# Patient Record
Sex: Male | Born: 1960
Health system: Southern US, Community
[De-identification: ages and names within clinical notes are randomized; demographics above are authoritative.]

## PROBLEM LIST (undated history)

## (undated) DIAGNOSIS — J40 Bronchitis, not specified as acute or chronic: Secondary | ICD-10-CM

---

## 2018-02-27 ENCOUNTER — Encounter (HOSPITAL_COMMUNITY): Payer: Self-pay

## 2018-02-27 ENCOUNTER — Emergency Department (HOSPITAL_COMMUNITY): Payer: Medicaid Other

## 2018-02-27 ENCOUNTER — Emergency Department (HOSPITAL_COMMUNITY)
Admission: EM | Admit: 2018-02-27 | Discharge: 2018-02-27 | Disposition: A | Discharge status: Home / Self Care | Payer: Medicaid Other | Attending: Emergency Medicine | Admitting: Emergency Medicine

## 2018-02-27 DIAGNOSIS — R05 Cough: Secondary | ICD-10-CM | POA: Diagnosis present

## 2018-02-27 DIAGNOSIS — F17210 Nicotine dependence, cigarettes, uncomplicated: Secondary | ICD-10-CM | POA: Insufficient documentation

## 2018-02-27 DIAGNOSIS — J209 Acute bronchitis, unspecified: Secondary | ICD-10-CM | POA: Diagnosis not present

## 2018-02-27 DIAGNOSIS — R062 Wheezing: Secondary | ICD-10-CM

## 2018-02-27 DIAGNOSIS — Z720 Tobacco use: Secondary | ICD-10-CM

## 2018-02-27 MED ORDER — IPRATROPIUM-ALBUTEROL 0.5-2.5 (3) MG/3ML IN SOLN
3.0000 mL | Freq: Once | RESPIRATORY_TRACT | Status: AC
  Administered 2018-02-27: 3 mL via RESPIRATORY_TRACT
  Filled 2018-02-27: qty 3

## 2018-02-27 MED ORDER — HYDROCODONE-HOMATROPINE 5-1.5 MG/5ML PO SYRP: 5.0000 mL | ORAL_SOLUTION | Freq: Four times a day (QID) | ORAL | 0 refills | Status: DC | PRN

## 2018-02-27 MED ORDER — PREDNISONE 20 MG PO TABS: ORAL_TABLET | ORAL | 0 refills | Status: DC

## 2018-02-27 MED ORDER — ALBUTEROL SULFATE HFA 108 (90 BASE) MCG/ACT IN AERS
2.0000 | INHALATION_SPRAY | RESPIRATORY_TRACT | Status: DC
  Administered 2018-02-27: 2 via RESPIRATORY_TRACT
  Filled 2018-02-27: qty 6.7

## 2018-02-27 MED ORDER — ALBUTEROL SULFATE (2.5 MG/3ML) 0.083% IN NEBU
5.0000 mg | INHALATION_SOLUTION | Freq: Once | RESPIRATORY_TRACT | Status: AC
  Administered 2018-02-27: 5 mg via RESPIRATORY_TRACT
  Filled 2018-02-27: qty 6

## 2018-02-27 MED ORDER — PREDNISONE 20 MG PO TABS
60.0000 mg | ORAL_TABLET | Freq: Once | ORAL | Status: AC
  Administered 2018-02-27: 60 mg via ORAL
  Filled 2018-02-27: qty 3

## 2018-02-27 NOTE — ED Provider Notes (Signed)
[facility] EMERGENCY DEPARTMENT Provider Note   CSN: [id number] Arrival date & time: 02/27/18  1338     History   Chief Complaint Chief Complaint  Patient presents with  . Wheezing    HPI Dwayne Craig is a 57 y.o. male.  HPI Patient reports he is coughing for about a week.  Cough is fairly dry.  He reports he had some fever the beginning of the week but that is resolved.  Cough is worse at night.  No chest pain.  Slight shortness of breath with coughing episodes.  No nausea no vomiting.  No lower extremity swelling.  Patient reports he tries to keep himself in good health.  He denies other medical problems. History reviewed. No pertinent past medical history.  There are no active problems to display for this patient.   History reviewed. No pertinent surgical history.      Home Medications    Prior to Admission medications   Medication Sig Start Date End Date Taking? Authorizing Provider  HYDROcodone-homatropine (HYCODAN) 5-1.5 MG/5ML syrup Take 5 mLs by mouth every 6 (six) hours as needed for cough. 5 to 10 mL's for severe nigthtime cough 02/27/18   Arby Barrette, MD  predniSONE (DELTASONE) 20 MG tablet 2 tabs po daily x 3 days 02/27/18   Arby Barrette, MD    Family History No family history on file.  Social History Social History   Tobacco Use  . Smoking status: Current Every Day Smoker    Packs/day: 0.50    Types: Cigarettes  . Smokeless tobacco: Never Used  Substance Use Topics  . Alcohol use: Never    Frequency: Never  . Drug use: Not Currently     Allergies   Patient has no known allergies.   Review of Systems Review of Systems 10 Systems reviewed and are negative for acute change except as noted in the HPI.   Physical Exam Updated Vital Signs BP 124/71   Pulse 69   Temp 98 F (36.7 C) (Oral)   Resp 18   SpO2 100%   Physical Exam  Constitutional: He is oriented to person, place, and time.  She is alert and  nontoxic.  No respiratory distress.  Thin but well-nourished well-developed.  HENT:  Head: Normocephalic and atraumatic.  Mouth/Throat: Oropharynx is clear and moist.  Eyes: EOM are normal.  Cardiovascular: Normal rate, regular rhythm, normal heart sounds and intact distal pulses.  Pulmonary/Chest:  Cough paroxysmal with deep inspiration.  Diffuse wheeze throughout all lung fields.  Adequate airflow to the bases.  Abdominal: Soft. He exhibits no distension. There is no tenderness. There is no guarding.  Musculoskeletal: Normal range of motion. He exhibits no edema or tenderness.  No peripheral edema.  Calves are soft and nontender.  Neurological: He is alert and oriented to person, place, and time. He exhibits normal muscle tone. Coordination normal.  Skin: Skin is warm and dry.  Psychiatric: He has a normal mood and affect.     ED Treatments / Results  Labs (all labs ordered are listed, but only abnormal results are displayed) Labs Reviewed - No data to display  EKG None  Radiology Dg Chest 2 View  Result Date: 02/27/2018 CLINICAL DATA:  Wheezing and productive cough 1 week with fever. EXAM: CHEST - 2 VIEW COMPARISON:  None. FINDINGS: Lungs are somewhat hyperexpanded without focal airspace consolidation or effusion. Minimal prominence of the hilar regions. Cardiomediastinal silhouette is within normal. Bones and soft tissues are normal. IMPRESSION: No  acute cardiopulmonary disease. Minimal prominence of the hilar regions. Recommend follow-up chest radiograph 3 months. Electronically Signed   By: Elberta Fortisaniel  Boyle M.D.   On: 02/27/2018 14:56    Procedures Procedures (including critical care time)  Medications Ordered in ED Medications  albuterol (PROVENTIL HFA;VENTOLIN HFA) 108 (90 Base) MCG/ACT inhaler 2 puff (2 puffs Inhalation Given 02/27/18 1557)  albuterol (PROVENTIL) (2.5 MG/3ML) 0.083% nebulizer solution 5 mg (5 mg Nebulization Given 02/27/18 1349)  ipratropium-albuterol  (DUONEB) 0.5-2.5 (3) MG/3ML nebulizer solution 3 mL (3 mLs Nebulization Given 02/27/18 1557)  predniSONE (DELTASONE) tablet 60 mg (60 mg Oral Given 02/27/18 1556)     Initial Impression / Assessment and Plan / ED Course  I have reviewed the triage vital signs and the nursing notes.  Pertinent labs & imaging results that were available during my care of the patient were reviewed by me and considered in my medical decision making (see chart for details).     Final Clinical Impressions(s) / ED Diagnoses   Final diagnoses:  Acute bronchitis, unspecified organism  Wheezing  Tobacco abuse   Has had cough with wheezing for about a week.  Chest x-ray does not show focal pneumonia.  Patient does not have pleuritic chest pain or shortness of breath.  He does have smoking history.  At this time will treat for bronchitis.  Return precautions reviewed.  Patient reports he will get established with a primary care provider. ED Discharge Orders        Ordered    predniSONE (DELTASONE) 20 MG tablet     02/27/18 1652    HYDROcodone-homatropine (HYCODAN) 5-1.5 MG/5ML syrup  Every 6 hours PRN     02/27/18 1652       Arby BarrettePfeiffer, Dell Briner, MD 02/27/18 1657

## 2018-02-27 NOTE — ED Notes (Signed)
Patient able to ambulate independently  

## 2018-02-27 NOTE — ED Notes (Signed)
Patient already dressed when this RN came in to discharge, refused vitals recheck before d/c.

## 2018-02-27 NOTE — Discharge Instructions (Addendum)
1.  Prednisone as prescribed for the next 3 days.  Use the inhaler you are given in the emergency department every 4 hours for the next 2 to 3 days then as needed.  You must quit smoking. 2.  Schedule an appointment with a family doctor.  Use the referral number in your discharge instructions to find one. 3.  Return to the emergency department if you have worsening cough, wheeze or shortness of breath.

## 2018-02-27 NOTE — ED Triage Notes (Signed)
Pt presents for evaluation of wheezing and coughing x 1 week. Pt reports worse at night. No dx of asthma, copd, chf. Pt reports some shortness of breath.

## 2018-02-27 NOTE — ED Notes (Signed)
Pt completed breathing treatment, will notify PA

## 2018-02-27 NOTE — ED Notes (Signed)
Pt transported to X-Ray at 14:32

## 2018-06-17 ENCOUNTER — Other Ambulatory Visit: Payer: Self-pay

## 2018-06-17 ENCOUNTER — Emergency Department (HOSPITAL_COMMUNITY)
Admission: EM | Admit: 2018-06-17 | Discharge: 2018-06-17 | Disposition: A | Discharge status: Home / Self Care | Payer: Medicaid Other | Attending: Emergency Medicine | Admitting: Emergency Medicine

## 2018-06-17 ENCOUNTER — Encounter (HOSPITAL_COMMUNITY): Payer: Self-pay | Admitting: *Deleted

## 2018-06-17 DIAGNOSIS — F17210 Nicotine dependence, cigarettes, uncomplicated: Secondary | ICD-10-CM | POA: Insufficient documentation

## 2018-06-17 DIAGNOSIS — M542 Cervicalgia: Secondary | ICD-10-CM | POA: Diagnosis present

## 2018-06-17 MED ORDER — METHOCARBAMOL 500 MG PO TABS: 500.0000 mg | ORAL_TABLET | Freq: Three times a day (TID) | ORAL | 0 refills | Status: DC | PRN

## 2018-06-17 MED ORDER — NAPROXEN 500 MG PO TABS: 500.0000 mg | ORAL_TABLET | Freq: Two times a day (BID) | ORAL | 0 refills | Status: DC

## 2018-06-17 NOTE — ED Triage Notes (Signed)
Pt in c/o L shoulder pain and L neck pain worse with movement at work and turning head onset x 1 wk, denies n.v.d, denies SOB

## 2018-06-17 NOTE — ED Provider Notes (Signed)
[facility] EMERGENCY DEPARTMENT Provider Note   CSN: [id number] Arrival date & time: 06/17/18  1017     History   Chief Complaint Chief Complaint  Patient presents with  . Shoulder Pain    HPI Dwayne Craig is a 57 y.o. male with a hx of tobacco abuse who presents to the ED with complaints of neck pain for the past 1 week.  Patient describes the pain as being to the left side of his neck, fairly constant, worse with certain movements and positions.  No alleviating factors.  He has not tried intervention prior to arrival.  States pain is achy. He states he does a lot of heavy lifting and movements at work and thinks this may have caused the problem. Denies numbness, tingling, weakness, incontinence to bowel/bladder, fever, chills, IV drug use, or hx of cancer. Denies traumatic injury. Denies dyspnea/chest pain.   HPI  History reviewed. No pertinent past medical history.  There are no active problems to display for this patient.   History reviewed. No pertinent surgical history.      Home Medications    Prior to Admission medications   Medication Sig Start Date End Date Taking? Authorizing Provider  HYDROcodone-homatropine (HYCODAN) 5-1.5 MG/5ML syrup Take 5 mLs by mouth every 6 (six) hours as needed for cough. 5 to 10 mL's for severe nigthtime cough 02/27/18   Arby Barrette, MD  predniSONE (DELTASONE) 20 MG tablet 2 tabs po daily x 3 days 02/27/18   Arby Barrette, MD    Family History No family history on file.  Social History Social History   Tobacco Use  . Smoking status: Current Every Day Smoker    Packs/day: 0.50    Types: Cigarettes  . Smokeless tobacco: Never Used  Substance Use Topics  . Alcohol use: Never    Frequency: Never  . Drug use: Not Currently     Allergies   Patient has no known allergies.   Review of Systems Review of Systems  Constitutional: Negative for chills and fever.  Respiratory: Negative for shortness of  breath.   Cardiovascular: Negative for chest pain.  Musculoskeletal: Positive for neck pain. Negative for neck stiffness.  Skin: Negative for color change, pallor and wound.  Neurological: Negative for weakness and numbness.       Negative for incontinence or saddle anesthesia.      Physical Exam Updated Vital Signs BP 106/74 (BP Location: Right Arm)   Pulse 77   Temp (!) 97.2 F (36.2 C) (Oral)   Resp 16   Ht 5\' 11"  (1.803 m)   Wt 77.1 kg   SpO2 97%   BMI 23.71 kg/m   Physical Exam  Constitutional: He appears well-developed and well-nourished. No distress.  HENT:  Head: Normocephalic and atraumatic.  Eyes: Conjunctivae are normal. Right eye exhibits no discharge. Left eye exhibits no discharge.  Neck: Muscular tenderness (left sided) present. No spinous process tenderness present. No neck rigidity. No edema and no erythema present.  No palpable step off.   Cardiovascular: Normal rate and regular rhythm.  Pulses:      Radial pulses are 2+ on the right side, and 2+ on the left side.  Pulmonary/Chest: Effort normal and breath sounds normal. No respiratory distress.  Musculoskeletal:  No obvious deformity, appreciable swelling, erythema, ecchymosis, or open wounds. Back: No point/focal vertebral tenderness or palpable step-off Upper extremities/Lower extremities: Patient has full active range of motion to all joints without palpable bony tenderness.  Neurological: He is  alert.  Clear speech.  Sensation grossly intact bilateral upper and lower extremities.  5 out of 5 symmetric grip strength.  5 out of 5 strength plantar dorsiflexion bilaterally.  Ambulatory without difficulty.  Psychiatric: He has a normal mood and affect. His behavior is normal. Thought content normal.  Nursing note and vitals reviewed.   ED Treatments / Results  Labs (all labs ordered are listed, but only abnormal results are displayed) Labs Reviewed - No data to display  EKG None  Radiology No  results found.  Procedures Procedures (including critical care time)  Medications Ordered in ED Medications - No data to display   Initial Impression / Assessment and Plan / ED Course  I have reviewed the triage vital signs and the nursing notes.  Pertinent labs & imaging results that were available during my care of the patient were reviewed by me and considered in my medical decision making (see chart for details).    Patient presents with complaint of nec pain pain.  Patient is nontoxic appearing, vitals are without significant abnormality. Patient has normal neurologic exam, no midline tenderness to palpation. He is ambulatory in the ED.  No red flags.. Most likely muscle strain versus spasm. Considered disc disease, meningitis, pulmonary embolism, aortic aneurysm/dissection, or epidural abscess however these do not fit clinical picture at this time. Will treat with Naproxen and Robaxin, discussed with patient that they are not to drive or operate heavy machinery while taking Robaxin. I discussed treatment plan, need for PCP follow-up, and return precautions with the patient. Provided opportunity for questions, patient confirmed understanding and is in agreement with plan.   Final Clinical Impressions(s) / ED Diagnoses   Final diagnoses:  Neck pain    ED Discharge Orders         Ordered    naproxen (NAPROSYN) 500 MG tablet  2 times daily     06/17/18 1114    methocarbamol (ROBAXIN) 500 MG tablet  Every 8 hours PRN     06/17/18 1114           Sudiksha Sadler, [place] R, PA-C 06/17/18 1116    Mesner, Barbara Cower, MD 06/17/18 1127

## 2018-06-17 NOTE — Discharge Instructions (Addendum)
Neck/shoulder pain: We suspect your discomfort is due to a muscle spasm/strain.   I have prescribed you an anti-inflammatory medication and a muscle relaxer.   Naproxen is a nonsteroidal anti-inflammatory medication that will help with pain and swelling. Be sure to take this medication as prescribed with food, 1 pill every 12 hours,  It should be taken with food, as it can cause stomach upset, and more seriously, stomach bleeding. Do not take other nonsteroidal anti-inflammatory medications with this such as Advil, Motrin, or Aleve.   Robaxin is the muscle relaxer I have prescribed, this is meant to help with muscle tightness. Be aware that this medication may make you drowsy therefore the first time you take this it should be at a time you are in an environment where you can rest. Do not drive or operate heavy machinery when taking this medication.   In addition you may also take Tylenol. Tylenol is generally safe, though you should not take more than 8 of the extra strength (500mg ) pills a day.  We have prescribed you new medication(s) today. Discuss the medications prescribed today with your pharmacist as they can have adverse effects and interactions with your other medicines including over the counter and prescribed medications. Seek medical evaluation if you start to experience new or abnormal symptoms after taking one of these medicines, seek care immediately if you start to experience difficulty breathing, feeling of your throat closing, facial swelling, or rash as these could be indications of a more serious allergic reaction  The application of heat can help soothe the pain.  Maintaining your daily activities, including walking, is encourged, as it will help you get better faster than just staying in bed.  Your pain should get better over the next 2 weeks.  You will need to follow up with  Your primary healthcare provider in 1-2 weeks for reassessment, if you do not have a primary care  provider one is provided in your discharge instructions. However if you develop severe or worsening pain neck pain with fever, numbness, weakness, loss of bowel or bladder control, or inability to walk or urinate, you should return to the ER immediately. Return for shortness of breath or chest pain or any other concerns. Please follow up with your doctor this week for a recheck if still having symptoms.

## 2018-06-30 ENCOUNTER — Encounter (HOSPITAL_COMMUNITY): Payer: Self-pay | Admitting: Emergency Medicine

## 2018-06-30 ENCOUNTER — Other Ambulatory Visit: Payer: Self-pay

## 2018-06-30 ENCOUNTER — Emergency Department (HOSPITAL_COMMUNITY)
Admission: EM | Admit: 2018-06-30 | Discharge: 2018-06-30 | Disposition: A | Discharge status: Home / Self Care | Payer: Medicaid Other | Attending: Emergency Medicine | Admitting: Emergency Medicine

## 2018-06-30 DIAGNOSIS — M62838 Other muscle spasm: Secondary | ICD-10-CM | POA: Insufficient documentation

## 2018-06-30 DIAGNOSIS — F17210 Nicotine dependence, cigarettes, uncomplicated: Secondary | ICD-10-CM | POA: Diagnosis not present

## 2018-06-30 DIAGNOSIS — M542 Cervicalgia: Secondary | ICD-10-CM | POA: Diagnosis present

## 2018-06-30 MED ORDER — IBUPROFEN 800 MG PO TABS: 800.0000 mg | ORAL_TABLET | Freq: Three times a day (TID) | ORAL | 0 refills | Status: DC | PRN

## 2018-06-30 MED ORDER — CYCLOBENZAPRINE HCL 10 MG PO TABS: 10.0000 mg | ORAL_TABLET | Freq: Two times a day (BID) | ORAL | 0 refills | Status: DC | PRN

## 2018-06-30 NOTE — ED Provider Notes (Signed)
[facility] EMERGENCY DEPARTMENT Provider Note   CSN: [id number] Arrival date & time: 06/30/18  1626     History   Chief Complaint Chief Complaint  Patient presents with  . Neck Injury    HPI Dwayne Craig is a 57 y.o. male.  Patient with left-sided neck pain, left trapezius pain, lower back pain for the last several days.  Patient has history of muscle spasms.  Patient works Youth worker job.  Uses naproxen and Robaxin in the past with relief.  States that he flared something at work.  No significant trauma history.  The history is provided by the patient.  Illness  This is a recurrent problem. The current episode started 2 days ago. The problem occurs constantly. The problem has not changed since onset.Pertinent negatives include no chest pain, no abdominal pain and no shortness of breath. Nothing aggravates the symptoms. Nothing relieves the symptoms. He has tried nothing for the symptoms. The treatment provided no relief.    History reviewed. No pertinent past medical history.  There are no active problems to display for this patient.   History reviewed. No pertinent surgical history.      Home Medications    Prior to Admission medications   Medication Sig Start Date End Date Taking? Authorizing Provider  cyclobenzaprine (FLEXERIL) 10 MG tablet Take 1 tablet (10 mg total) by mouth 2 (two) times daily as needed for up to 10 doses for muscle spasms. 06/30/18   Aasim Restivo, DO  ibuprofen (ADVIL,MOTRIN) 800 MG tablet Take 1 tablet (800 mg total) by mouth every 8 (eight) hours as needed for up to 30 doses. 06/30/18   Virgina Norfolk, DO    Family History No family history on file.  Social History Social History   Tobacco Use  . Smoking status: Current Every Day Smoker    Packs/day: 0.50    Types: Cigarettes  . Smokeless tobacco: Never Used  Substance Use Topics  . Alcohol use: Never    Frequency: Never  . Drug use: Not Currently      Allergies   Patient has no known allergies.   Review of Systems Review of Systems  Constitutional: Negative for chills and fever.  HENT: Negative for ear pain and sore throat.   Eyes: Negative for pain and visual disturbance.  Respiratory: Negative for cough and shortness of breath.   Cardiovascular: Negative for chest pain and palpitations.  Gastrointestinal: Negative for abdominal pain and vomiting.  Genitourinary: Negative for dysuria and hematuria.  Musculoskeletal: Positive for arthralgias, back pain, neck pain and neck stiffness. Negative for gait problem and joint swelling.  Skin: Negative for color change and rash.  Neurological: Negative for seizures and syncope.  All other systems reviewed and are negative.    Physical Exam Updated Vital Signs  ED Triage Vitals  Enc Vitals Group     BP 06/30/18 1631 (!) 149/97     Pulse Rate 06/30/18 1631 87     Resp 06/30/18 1631 18     Temp 06/30/18 1631 97.8 F (36.6 C)     Temp Source 06/30/18 1631 Oral     SpO2 06/30/18 1631 96 %     Weight --      Height --      Head Circumference --      Peak Flow --      Pain Score 06/30/18 1632 7     Pain Loc --      Pain Edu? --  Excl. in GC? --     Physical Exam  Constitutional: He is oriented to person, place, and time. He appears well-developed and well-nourished.  HENT:  Head: Normocephalic and atraumatic.  Mouth/Throat: No oropharyngeal exudate.  Eyes: Pupils are equal, round, and reactive to light. Conjunctivae and EOM are normal.  Neck: Normal range of motion. Neck supple.  Cardiovascular: Normal rate, regular rhythm, normal heart sounds and intact distal pulses.  No murmur heard. Pulmonary/Chest: Effort normal and breath sounds normal. No respiratory distress.  Abdominal: Soft. Bowel sounds are normal. He exhibits no distension. There is no tenderness.  Musculoskeletal: Normal range of motion. He exhibits no edema.  Patient tender to left-sided paraspinal  cervical muscles, left trapezius muscle, left paraspinal lumbar muscles  Neurological: He is alert and oriented to person, place, and time. No cranial nerve deficit or sensory deficit. He exhibits normal muscle tone. Coordination normal.  5+ out of 5 strength, normal sensation, no drift, normal finger-to-nose finger.  Skin: Skin is warm and dry. Capillary refill takes less than 2 seconds.  Psychiatric: He has a normal mood and affect.  Nursing note and vitals reviewed.    ED Treatments / Results  Labs (all labs ordered are listed, but only abnormal results are displayed) Labs Reviewed - No data to display  EKG None  Radiology No results found.  Procedures Procedures (including critical care time)  Medications Ordered in ED Medications - No data to display   Initial Impression / Assessment and Plan / ED Course  I have reviewed the triage vital signs and the nursing notes.  Pertinent labs & imaging results that were available during my care of the patient were reviewed by me and considered in my medical decision making (see chart for details).     Dwayne Craig is a 57 year old male with no significant medical history who presents to the ED with neck spasms, low back pain.  Patient with normal vitals.  No fever.  Patient with left-sided neck spasms for the last several days.  Left lower back pain as well.  Patient is left-handed.  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  Intermittently has muscle spasms.  Denies any nausea, vomiting, weakness, numbness, tingling.  Patient with normal neurological exam.  No midline spinal tenderness.  Has increased tone over his para spinal cervical and lumbar muscles consistent with spasm.  Patient given prescription for Flexeril and Motrin.  Recommend rest, ice, heat.  No concern for radiculopathy, cord compression.  Patient does not have any loss of bowel or bladder.  No fever.  No concern for epidural abscess or cauda equina.  Recommend close follow-up with primary  care doctor and discharged from ED in good condition.  Given return precautions.  This chart was dictated using voice recognition software.  Despite best efforts to proofread,  errors can occur which can change the documentation meaning.   Final Clinical Impressions(s) / ED Diagnoses   Final diagnoses:  Trapezius muscle spasm    ED Discharge Orders         Ordered    ibuprofen (ADVIL,MOTRIN) 800 MG tablet  Every 8 hours PRN     06/30/18 1731    cyclobenzaprine (FLEXERIL) 10 MG tablet  2 times daily PRN     06/30/18 1731           Virgina NorfolkCuratolo, Nil Xiong, DO 06/30/18 1742

## 2018-06-30 NOTE — ED Triage Notes (Signed)
Pt reports neck pain x 3 weeks. Pt reports lower back pain x 2-3 days. Pt reports lifting heavy boxes, denies recent injury. Pt denies taking any medication, took hot shower with some relief. Pt denies any changes in urination.

## 2018-12-22 ENCOUNTER — Other Ambulatory Visit: Payer: Self-pay

## 2018-12-22 ENCOUNTER — Encounter (HOSPITAL_COMMUNITY): Payer: Self-pay | Admitting: *Deleted

## 2018-12-22 ENCOUNTER — Emergency Department (HOSPITAL_COMMUNITY)
Admission: EM | Admit: 2018-12-22 | Discharge: 2018-12-22 | Disposition: A | Discharge status: Home / Self Care | Payer: Medicaid Other | Attending: Emergency Medicine | Admitting: Emergency Medicine

## 2018-12-22 DIAGNOSIS — F17210 Nicotine dependence, cigarettes, uncomplicated: Secondary | ICD-10-CM | POA: Insufficient documentation

## 2018-12-22 DIAGNOSIS — M25551 Pain in right hip: Secondary | ICD-10-CM

## 2018-12-22 MED ORDER — METHOCARBAMOL 500 MG PO TABS: 500.0000 mg | ORAL_TABLET | Freq: Every evening | ORAL | 0 refills | Status: DC | PRN

## 2018-12-22 MED ORDER — NAPROXEN 500 MG PO TABS: 500.0000 mg | ORAL_TABLET | Freq: Two times a day (BID) | ORAL | 0 refills | Status: AC

## 2018-12-22 NOTE — Discharge Instructions (Signed)

## 2018-12-22 NOTE — ED Provider Notes (Signed)
[facility] Provider Note   CSN: [id number] Arrival date & time: 12/22/18  1450    History   Chief Complaint Chief Complaint  Patient presents with  . Hip Pain    Rt    HPI Dwayne PaxVictor Craig is a 58 y.o. male.     HPI   Pt is a 58 y/o male who presents to the ED today c/o right pain that started 2 days ago. States pain was 10/10 earlier today but he took a muscle relaxer and now his pain has improved to a 9/10. Pain is constant. Pain does not radiate. Denies recent falls or trauma. He also tried ibuprofen without resolution of sxs. Denies redness, swelling to the joint. No fevers.  History reviewed. No pertinent past medical history.  There are no active problems to display for this patient.   History reviewed. No pertinent surgical history.      Home Medications    Prior to Admission medications   Medication Sig Start Date End Date Taking? Authorizing Provider  cyclobenzaprine (FLEXERIL) 10 MG tablet Take 1 tablet (10 mg total) by mouth 2 (two) times daily as needed for up to 10 doses for muscle spasms. 06/30/18   Curatolo, Adam, DO  ibuprofen (ADVIL,MOTRIN) 800 MG tablet Take 1 tablet (800 mg total) by mouth every 8 (eight) hours as needed for up to 30 doses. 06/30/18   Curatolo, Adam, DO  methocarbamol (ROBAXIN) 500 MG tablet Take 1 tablet (500 mg total) by mouth at bedtime as needed for muscle spasms. 12/22/18   Jeryl Umholtz S, PA-C  naproxen (NAPROSYN) 500 MG tablet Take 1 tablet (500 mg total) by mouth 2 (two) times daily for 5 days. 12/22/18 12/27/18  Annabelle Rexroad S, PA-C    Family History History reviewed. No pertinent family history.  Social History Social History   Tobacco Use  . Smoking status: Current Every Day Smoker    Packs/day: 0.50    Types: Cigarettes  . Smokeless tobacco: Never Used  Substance Use Topics  . Alcohol use: Never    Frequency: Never  . Drug use: Not Currently     Allergies   Patient  has no known allergies.   Review of Systems Review of Systems  Constitutional: Negative for fever.  Musculoskeletal: Negative for back pain.       Right hip pain  Skin: Negative for color change.  Neurological: Negative for weakness and numbness.     Physical Exam Updated Vital Signs BP 119/79 (BP Location: Right Arm)   Pulse 70   Temp 98.1 F (36.7 C) (Oral)   Resp 15   Ht 5\' 11"  (1.803 m)   Wt 74.8 kg   SpO2 100%   BMI 23.01 kg/m   Physical Exam Nursing note reviewed.  Constitutional:      General: He is not in acute distress.    Appearance: He is well-developed.     Comments: Patient texting on phone throughout history and physical.  Eyes:     Conjunctiva/sclera: Conjunctivae normal.  Cardiovascular:     Rate and Rhythm: Normal rate and regular rhythm.  Pulmonary:     Effort: Pulmonary effort is normal.     Breath sounds: Normal breath sounds.  Musculoskeletal:     Comments: Mild ttp to the left upper buttock just beneath SI joint, and just lateral to this..  Full range of motion at the hip joint without any obvious pain.  No swelling erythema or warmth to the joint.  No midline lumbar tenderness.  5/5 strength to the bilateral lower extremities.  Neuro sensation current bilateral lower extremities.  Ambulatory with steady gait.  Skin:    General: Skin is warm and dry.  Neurological:     Mental Status: He is alert and oriented to person, place, and time.     ED Treatments / Results  Labs (all labs ordered are listed, but only abnormal results are displayed) Labs Reviewed - No data to display  EKG None  Radiology No results found.  Procedures Procedures (including critical care time)  Medications Ordered in ED Medications - No data to display   Initial Impression / Assessment and Plan / ED Course  I have reviewed the triage vital signs and the nursing notes.  Pertinent labs & imaging results that were available during my care of the patient were  reviewed by me and considered in my medical decision making (see chart for details).     Final Clinical Impressions(s) / ED Diagnoses   Final diagnoses:  Right hip pain   58 year old male presenting the ED complaining of right hip pain.  Actually has tenderness just along the left upper buttock beneath the SI joint, and just lateral to this. Full range of motion at the hip joint without any obvious pain.  No swelling erythema or warmth to the joint.  No midline lumbar tenderness.  5/5 strength to the bilateral lower extremities.  Neuro sensation current bilateral lower extremities.  Ambulatory with steady gait.  Will give naproxen and short course of muscle relaxers.  Advised return the ER for new or worsening symptoms.  He was understanding of plan and reasons return.  Questions answered.  Patient stable discharge.  ED Discharge Orders         Ordered    naproxen (NAPROSYN) 500 MG tablet  2 times daily     12/22/18 1553    methocarbamol (ROBAXIN) 500 MG tablet  At bedtime PRN     12/22/18 1553           Kinsie Belford S, PA-C 12/22/18 1559    Terrilee Files, MD 12/23/18 [phone]

## 2018-12-22 NOTE — ED Triage Notes (Signed)
PT reports Rt hip pain for 2 days with out injury. Pt reports he took a muscle relaxer ? Name of med. Pt denies any injury to Rt hip.

## 2018-12-22 NOTE — ED Notes (Signed)
Patient verbalizes understanding of discharge instructions . Opportunity for questions and answers were provided . Armband removed by staff ,Pt discharged from ED. W/C  offered at D/C  and Declined W/C at D/C and was escorted to lobby by RN.  

## 2019-10-16 ENCOUNTER — Encounter (HOSPITAL_COMMUNITY): Payer: Self-pay | Admitting: Emergency Medicine

## 2019-10-16 ENCOUNTER — Emergency Department (HOSPITAL_COMMUNITY): Payer: Medicaid Other

## 2019-10-16 ENCOUNTER — Other Ambulatory Visit: Payer: Self-pay

## 2019-10-16 ENCOUNTER — Emergency Department (HOSPITAL_COMMUNITY)
Admission: EM | Admit: 2019-10-16 | Discharge: 2019-10-16 | Disposition: A | Discharge status: Home / Self Care | Payer: Medicaid Other | Attending: Emergency Medicine | Admitting: Emergency Medicine

## 2019-10-16 DIAGNOSIS — Z23 Encounter for immunization: Secondary | ICD-10-CM | POA: Insufficient documentation

## 2019-10-16 DIAGNOSIS — L03011 Cellulitis of right finger: Secondary | ICD-10-CM

## 2019-10-16 DIAGNOSIS — F17210 Nicotine dependence, cigarettes, uncomplicated: Secondary | ICD-10-CM | POA: Insufficient documentation

## 2019-10-16 DIAGNOSIS — M79641 Pain in right hand: Secondary | ICD-10-CM | POA: Diagnosis present

## 2019-10-16 MED ORDER — LIDOCAINE HCL (PF) 1 % IJ SOLN
5.0000 mL | Freq: Once | INTRAMUSCULAR | Status: AC
  Administered 2019-10-16: 13:00:00 5 mL
  Filled 2019-10-16: qty 5

## 2019-10-16 MED ORDER — CEPHALEXIN 500 MG PO CAPS: 500.0000 mg | ORAL_CAPSULE | Freq: Two times a day (BID) | ORAL | 0 refills | Status: AC

## 2019-10-16 MED ORDER — TETANUS-DIPHTH-ACELL PERTUSSIS 5-2.5-18.5 LF-MCG/0.5 IM SUSP
0.5000 mL | Freq: Once | INTRAMUSCULAR | Status: AC
  Administered 2019-10-16: 0.5 mL via INTRAMUSCULAR
  Filled 2019-10-16: qty 0.5

## 2019-10-16 NOTE — ED Provider Notes (Signed)
[facility] EMERGENCY DEPARTMENT Provider Note   CSN: [id number] Arrival date & time: 10/16/19  1103    History Chief Complaint  Patient presents with  . Hand Pain    Dwayne Craig is a 59 y.o. male with past medical history who presents for evaluation of finger injury.  Patient states 1 month ago he had his second digit on his right upper extremity on a pan.  Patient states he has had pain to the distal aspect of this digit.  Has had some intermittent paresthesias to the tip however none currently.  Patient states over the past few days he has noticed some swelling to the proximal nailbed.  No obvious lacerations, bleeding or drainage.  He denies any decreased range of motion, fever, chills, nausea, vomiting.  No fusiform swelling of digit or pain to the flexor tendon.  Has not taking anything for pain.  Denies additional aggravating or alleviating factors.  History obtained from patient and past medical records.  No interpreter is used.  HPI     History reviewed. No pertinent past medical history.  There are no problems to display for this patient.   History reviewed. No pertinent surgical history.     No family history on file.  Social History   Tobacco Use  . Smoking status: Current Every Day Smoker    Packs/day: 0.50    Types: Cigarettes  . Smokeless tobacco: Never Used  Substance Use Topics  . Alcohol use: Never  . Drug use: Not Currently    Home Medications Prior to Admission medications   Medication Sig Start Date End Date Taking? Authorizing Provider  acetaminophen (TYLENOL) 500 MG tablet Take 500 mg by mouth every 6 (six) hours as needed for mild pain.   Yes [provider]  ibuprofen (ADVIL) 200 MG tablet Take 200 mg by mouth every 6 (six) hours as needed for mild pain.   Yes [provider]  cephALEXin (KEFLEX) 500 MG capsule Take 1 capsule (500 mg total) by mouth 2 (two) times daily for 7 days. 10/16/19 10/23/19  Jillien Yakel,  Kassadi Presswood A, PA-C    Allergies    Patient has no known allergies.  Review of Systems   Review of Systems  Constitutional: Negative.   HENT: Negative.   Respiratory: Negative.   Cardiovascular: Negative.   Gastrointestinal: Negative.   Genitourinary: Negative.   Musculoskeletal:       2nd finger pain  Skin:       Swelling to proximal nail fold  Neurological: Negative.   All other systems reviewed and are negative.   Physical Exam Updated Vital Signs BP (!) 162/93 (BP Location: Right Arm)   Pulse (!) 55   Temp 98 F (36.7 C) (Oral)   Resp 18   SpO2 99%   Physical Exam Vitals and nursing note reviewed.  Constitutional:      General: He is not in acute distress.    Appearance: He is well-developed. He is not ill-appearing, toxic-appearing or diaphoretic.  HENT:     Head: Normocephalic and atraumatic.     Nose: Nose normal.     Mouth/Throat:     Mouth: Mucous membranes are moist.  Eyes:     Pupils: Pupils are equal, round, and reactive to light.  Cardiovascular:     Rate and Rhythm: Normal rate and regular rhythm.     Pulses: Normal pulses.     Heart sounds: Normal heart sounds.  Pulmonary:     Effort: Pulmonary effort  is normal. No respiratory distress.     Breath sounds: Normal breath sounds.  Abdominal:     General: Bowel sounds are normal. There is no distension.     Palpations: Abdomen is soft.  Musculoskeletal:        General: Normal range of motion.     Cervical back: Normal range of motion and neck supple.     Comments: No tenderness to hand or digits.  Does have some mild swelling at the proximal nail fold consistent with paronychia.  No subungual hematoma.  Apartment soft.  No tenderness over flexural tendon.  No fusiform swelling. Full ROM without difficulty  Skin:    General: Skin is warm and dry.     Capillary Refill: Capillary refill takes less than 2 seconds.     Comments:  No erythema, warmth.  Neurological:     Mental Status: He is alert.      Comments: Sensation to radial and ulnar aspect of second digit to left upper extremity.      ED Results / Procedures / Treatments   Labs (all labs ordered are listed, but only abnormal results are displayed) Labs Reviewed - No data to display  EKG None  Radiology DG Finger Index Right  Result Date: 10/16/2019 CLINICAL DATA:  Remote injury of the second digit with persistent pain. EXAM: RIGHT INDEX FINGER 2+V COMPARISON:  None. FINDINGS: No fracture or dislocation. Joint spaces are preserved. No erosions. Regional soft tissues appear normal. No radiopaque foreign body. IMPRESSION: No explanation for patient's persistent second digit pain. Electronically Signed   By: Simonne Come M.D.   On: 10/16/2019 12:32    Procedures .Marland KitchenIncision and Drainage  Date/Time: 10/16/2019 1:34 PM Performed by: Linwood Dibbles, PA-C Authorized by: Linwood Dibbles, PA-C   Consent:    Consent obtained:  Verbal   Consent given by:  Patient   Risks discussed:  Bleeding, incomplete drainage, pain, damage to other organs and infection   Alternatives discussed:  No treatment, delayed treatment, alternative treatment, observation and referral Universal protocol:    Procedure explained and questions answered to patient or proxy's satisfaction: yes     Relevant documents present and verified: yes     Test results available and properly labeled: yes     Imaging studies available: yes     Required blood products, implants, devices, and special equipment available: yes     Site/side marked: yes     Immediately prior to procedure a time out was called: yes     Patient identity confirmed:  Verbally with patient Location:    Type:  Abscess   Location:  Upper extremity   Upper extremity location:  Finger   Finger location:  R index finger Pre-procedure details:    Skin preparation:  Betadine Anesthesia (see MAR for exact dosages):    Anesthesia method:  Nerve block   Block anesthetic:  Lidocaine 1% w/o epi    Block injection procedure:  Anatomic landmarks identified, introduced needle, incremental injection, negative aspiration for blood and anatomic landmarks palpated   Block outcome:  Anesthesia achieved Procedure type:    Complexity:  Complex Procedure details:    Incision types:  Single straight   Incision depth:  Subcutaneous   Scalpel blade:  11   Drainage:  Purulent   Drainage amount:  Moderate   Wound treatment:  Wound left open Post-procedure details:    Patient tolerance of procedure:  Tolerated well, no immediate complications   (including critical care  time)  Medications Ordered in ED Medications  Tdap (BOOSTRIX) injection 0.5 mL (0.5 mLs Intramuscular Given 10/16/19 1241)  lidocaine (PF) (XYLOCAINE) 1 % injection 5 mL (5 mLs Infiltration Given 10/16/19 1241)    ED Course  I have reviewed the triage vital signs and the nursing notes.  Pertinent labs & imaging results that were available during my care of the patient were reviewed by me and considered in my medical decision making (see chart for details).  78 old male appears otherwise well presents with paronychia to second digit.  Had injury to hand 1 month ago.  X-ray without evidence of fracture.  No fusiform swelling, full range of motion no tenderness over flexor tendon.  No evidence of flexor tendon infection.  Paronychia drained with purulent discharge.  Will DC home with instructions for warm compress and antibiotics.  Turn in 2 days for wound recheck.  No systemic symptoms.  The patient has been appropriately medically screened and/or stabilized in the ED. I have low suspicion for any other emergent medical condition which would require further screening, evaluation or treatment in the ED or require inpatient management.  Patient is hemodynamically stable and in no acute distress.  Patient able to ambulate in department prior to ED.  Evaluation does not show acute pathology that would require ongoing or additional emergent  interventions while in the emergency department or further inpatient treatment.  I have discussed the diagnosis with the patient and answered all questions.  Pain is been managed while in the emergency department and patient has no further complaints prior to discharge.  Patient is comfortable with plan discussed in room and is stable for discharge at this time.  I have discussed strict return precautions for returning to the emergency department.  Patient was encouraged to follow-up with PCP/specialist refer to at discharge.    MDM Rules/Calculators/A&P                      Final Clinical Impression(s) / ED Diagnoses Final diagnoses:  Paronychia of finger of right hand    Rx / DC Orders ED Discharge Orders         Ordered    cephALEXin (KEFLEX) 500 MG capsule  2 times daily     10/16/19 1334           Mckinzee Spirito A, PA-C 10/16/19 1337    Pricilla Loveless, MD 10/17/19 1501

## 2019-10-16 NOTE — Discharge Instructions (Signed)
Warm compress to finger.  Take the antibiotics as prescribed

## 2019-10-16 NOTE — ED Triage Notes (Signed)
Pt coming from home. Complaint of finger swelling. Pt reports hitting finger against pan one month ago at work. Pt states it was numb for a while but now has started swelling in the past few days.

## 2019-10-16 NOTE — ED Notes (Signed)
Pt discharge instructions and prescription reviewed with the patient. The patient verbalized understanding of both. Pt discharged. 

## 2019-10-21 ENCOUNTER — Encounter (HOSPITAL_COMMUNITY): Payer: Self-pay | Admitting: Emergency Medicine

## 2019-10-21 ENCOUNTER — Other Ambulatory Visit: Payer: Self-pay

## 2019-10-21 ENCOUNTER — Emergency Department (HOSPITAL_COMMUNITY)
Admission: EM | Admit: 2019-10-21 | Discharge: 2019-10-21 | Disposition: A | Discharge status: Home / Self Care | Payer: Medicaid Other | Attending: Emergency Medicine | Admitting: Emergency Medicine

## 2019-10-21 DIAGNOSIS — L03011 Cellulitis of right finger: Secondary | ICD-10-CM | POA: Insufficient documentation

## 2019-10-21 DIAGNOSIS — F17210 Nicotine dependence, cigarettes, uncomplicated: Secondary | ICD-10-CM | POA: Insufficient documentation

## 2019-10-21 DIAGNOSIS — M79644 Pain in right finger(s): Secondary | ICD-10-CM | POA: Diagnosis present

## 2019-10-21 DIAGNOSIS — L03019 Cellulitis of unspecified finger: Secondary | ICD-10-CM

## 2019-10-21 MED ORDER — SULFAMETHOXAZOLE-TRIMETHOPRIM 800-160 MG PO TABS: 1.0000 | ORAL_TABLET | Freq: Two times a day (BID) | ORAL | 0 refills | Status: AC

## 2019-10-21 MED ORDER — LIDOCAINE HCL (PF) 1 % IJ SOLN
5.0000 mL | Freq: Once | INTRAMUSCULAR | Status: AC
  Administered 2019-10-21: 5 mL
  Filled 2019-10-21: qty 5

## 2019-10-21 MED ORDER — EPSOM SALT GRAN: GRANULES | 0 refills | Status: AC

## 2019-10-21 MED ORDER — DICLOFENAC SODIUM 50 MG PO TBEC: 50.0000 mg | DELAYED_RELEASE_TABLET | Freq: Two times a day (BID) | ORAL | 0 refills | Status: AC

## 2019-10-21 NOTE — ED Notes (Signed)
Patient came back in and it to be seen.

## 2019-10-21 NOTE — ED Provider Notes (Signed)
[facility] EMERGENCY DEPARTMENT Provider Note   CSN: [id number] Arrival date & time: 10/21/19  1729     History Chief Complaint  Patient presents with  . Finger Injury    Shaw Dobek is a 59 y.o. male.  59 year old male presents with right index finger pain and swelling.  Patient had previously injured this finger about a month ago, came to the ER 4 days ago and had I&D of a paronychia on this finger.  Patient has been taking Keflex as prescribed however states finger is no longer draining and diffuse tip of finger is swollen and painful.        History reviewed. No pertinent past medical history.  There are no problems to display for this patient.   History reviewed. No pertinent surgical history.     No family history on file.  Social History   Tobacco Use  . Smoking status: Current Every Day Smoker    Packs/day: 0.50    Types: Cigarettes  . Smokeless tobacco: Never Used  Substance Use Topics  . Alcohol use: Never  . Drug use: Not Currently    Home Medications Prior to Admission medications   Medication Sig Start Date End Date Taking? Authorizing Provider  acetaminophen (TYLENOL) 500 MG tablet Take 500 mg by mouth every 6 (six) hours as needed for mild pain.    [provider]  cephALEXin (KEFLEX) 500 MG capsule Take 1 capsule (500 mg total) by mouth 2 (two) times daily for 7 days. 10/16/19 10/23/19  Henderly, Britni A, PA-C  diclofenac (VOLTAREN) 50 MG EC tablet Take 1 tablet (50 mg total) by mouth 2 (two) times daily for 10 days. 10/21/19 10/31/19  Jeannie Fend, PA-C  ibuprofen (ADVIL) 200 MG tablet Take 200 mg by mouth every 6 (six) hours as needed for mild pain.    [provider]  Magnesium Sulfate, Laxative, (EPSOM SALT) GRAN Mix with warm water, soak hand 3 times daily for 20 minutes 10/21/19   Army Melia A, PA-C  sulfamethoxazole-trimethoprim (BACTRIM DS) 800-160 MG tablet Take 1 tablet by mouth 2 (two) times daily  for 7 days. 10/21/19 10/28/19  Jeannie Fend, PA-C    Allergies    Patient has no known allergies.  Review of Systems   Review of Systems  Constitutional: Negative for fever.  Musculoskeletal: Positive for myalgias. Negative for arthralgias and joint swelling.  Skin: Negative for color change, pallor and wound.  Allergic/Immunologic: Negative for immunocompromised state.  Neurological: Negative for weakness and numbness.  Hematological: Negative for adenopathy.    Physical Exam Updated Vital Signs BP (!) 171/107 (BP Location: Right Arm)   Pulse 64   Temp 98.2 F (36.8 C) (Oral)   Resp 16   Ht 5\' 11"  (1.803 m)   Wt 70.3 kg   SpO2 99%   BMI 21.62 kg/m   Physical Exam Vitals and nursing note reviewed.  Constitutional:      General: He is not in acute distress.    Appearance: He is well-developed. He is not diaphoretic.  HENT:     Head: Normocephalic and atraumatic.  Pulmonary:     Effort: Pulmonary effort is normal.  Musculoskeletal:        General: Swelling and tenderness present.     Right hand: Swelling and tenderness present.       Arms:  Skin:    General: Skin is warm and dry.     Findings: No erythema or rash.  Neurological:  Mental Status: He is alert and oriented to person, place, and time.  Psychiatric:        Behavior: Behavior normal.     ED Results / Procedures / Treatments   Labs (all labs ordered are listed, but only abnormal results are displayed) Labs Reviewed - No data to display  EKG None  Radiology No results found.  Procedures .Marland KitchenIncision and Drainage  Date/Time: 10/21/2019 9:51 PM Performed by: Tacy Learn, PA-C Authorized by: Tacy Learn, PA-C   Consent:    Consent obtained:  Verbal   Consent given by:  Patient   Risks discussed:  Bleeding, infection, incomplete drainage and pain   Alternatives discussed:  No treatment Location:    Type:  Abscess   Location:  Upper extremity   Upper extremity location:   Finger   Finger location:  R index finger Pre-procedure details:    Skin preparation:  Betadine Anesthesia (see MAR for exact dosages):    Anesthesia method:  Nerve block   Block location:  Digital block   Block needle gauge:  25 G   Block anesthetic:  Lidocaine 1% w/o epi   Block technique:  Digital block right 2nd MCP   Block injection procedure:  Anatomic landmarks identified, anatomic landmarks palpated, introduced needle, negative aspiration for blood and incremental injection   Block outcome:  Anesthesia achieved Procedure type:    Complexity:  Simple Procedure details:    Needle aspiration: no     Incision types:  Stab incision   Incision depth:  Subcutaneous   Scalpel blade:  11   Wound management:  Probed and deloculated   Drainage:  Bloody   Drainage amount:  Scant   Wound treatment:  Wound left open   Packing materials:  None Post-procedure details:    Patient tolerance of procedure:  Tolerated well, no immediate complications   (including critical care time)  Medications Ordered in ED Medications  lidocaine (PF) (XYLOCAINE) 1 % injection 5 mL (5 mLs Infiltration Given 10/21/19 2122)    ED Course  I have reviewed the triage vital signs and the nursing notes.  Pertinent labs & imaging results that were available during my care of the patient were reviewed by me and considered in my medical decision making (see chart for details).  Clinical Course as of Oct 21 2151  Fri Oct 21, 3167  [id number] 59 year old male seen in this ER 5 days ago for paronychia, placed on Keflex, has been compliant with antibiotics, reports worsening pain and swelling in his finger.  On exam has swelling tenderness to the distal phalanx of the right index finger, no streaking, no pain with range of motion.  X-ray done in the ER 5 days ago was unremarkable.  Concern for felon, I&D of finger without any purulent drainage.  Patient replaced on Bactrim, request to soak his finger in warm Epson salt  water for 20 minutes 3 times daily while working finger through range of motion, referred to Ortho for follow-up.   [LM]    Clinical Course User Index [LM] Roque Lias   MDM Rules/Calculators/A&P                      Final Clinical Impression(s) / ED Diagnoses Final diagnoses:  Felon of finger    Rx / DC Orders ED Discharge Orders         Ordered    sulfamethoxazole-trimethoprim (BACTRIM DS) 800-160 MG tablet  2 times daily  10/21/19 2147    Magnesium Sulfate, Laxative, (EPSOM SALT) GRAN     10/21/19 2147    diclofenac (VOLTAREN) 50 MG EC tablet  2 times daily     10/21/19 2147           Alden Hipp 10/21/19 2153    Tilden Fossa, MD 10/22/19 0020

## 2019-10-21 NOTE — ED Notes (Addendum)
Note made in error

## 2019-10-21 NOTE — ED Triage Notes (Signed)
Pt reports being here for right index finger pain and swelling. Pt was seen here same 3/7 and they drained it.

## 2019-10-21 NOTE — Discharge Instructions (Addendum)
Take Bactrim as prescribed and complete the full course. Take Voltaren as needed as prescribed for pain. Soak your finger in warm Epson salt water 3 times daily for 20 minutes, move your finger while soaking.  Follow-up with the hand specialist, referral given.

## 2019-11-01 ENCOUNTER — Other Ambulatory Visit: Payer: Self-pay

## 2019-11-01 ENCOUNTER — Emergency Department (HOSPITAL_COMMUNITY)
Admission: EM | Admit: 2019-11-01 | Discharge: 2019-11-01 | Disposition: A | Discharge status: Home / Self Care | Payer: Medicaid Other | Attending: Emergency Medicine | Admitting: Emergency Medicine

## 2019-11-01 ENCOUNTER — Emergency Department (HOSPITAL_COMMUNITY): Payer: Medicaid Other

## 2019-11-01 DIAGNOSIS — M79644 Pain in right finger(s): Secondary | ICD-10-CM | POA: Diagnosis present

## 2019-11-01 DIAGNOSIS — L03011 Cellulitis of right finger: Secondary | ICD-10-CM | POA: Diagnosis not present

## 2019-11-01 DIAGNOSIS — F17210 Nicotine dependence, cigarettes, uncomplicated: Secondary | ICD-10-CM | POA: Diagnosis not present

## 2019-11-01 DIAGNOSIS — Z20822 Contact with and (suspected) exposure to covid-19: Secondary | ICD-10-CM | POA: Insufficient documentation

## 2019-11-01 LAB — CBG MONITORING, ED: Glucose-Capillary: 129 mg/dL — ABNORMAL HIGH (ref 70–99)

## 2019-11-01 LAB — RESPIRATORY PANEL BY RT PCR (FLU A&B, COVID)
Influenza A by PCR: NEGATIVE
Influenza B by PCR: NEGATIVE
SARS Coronavirus 2 by RT PCR: NEGATIVE

## 2019-11-01 MED ORDER — OXYCODONE HCL 5 MG PO TABS
5.0000 mg | ORAL_TABLET | Freq: Once | ORAL | Status: AC
  Administered 2019-11-01: 5 mg via ORAL
  Filled 2019-11-01: qty 1

## 2019-11-01 MED ORDER — OXYCODONE HCL 5 MG PO TABS: 5.0000 mg | ORAL_TABLET | ORAL | 0 refills | Status: AC | PRN

## 2019-11-01 MED ORDER — DOXYCYCLINE HYCLATE 50 MG PO CAPS: 100.0000 mg | ORAL_CAPSULE | Freq: Two times a day (BID) | ORAL | 0 refills | Status: AC

## 2019-11-01 NOTE — Consult Note (Signed)
Reason for Consult: Infection left index finger Referring Physician: ER staff  Dwayne Craig is an 59 y.o. male.  HPI: Patient is seen November 01, 2019 at the crest of the emergency room department in regards to his left index finger.  He injured this at work 2 months ago.  Notes from [facility] reflect that he was seen on March 7 by Dr. Gwenlyn Fudge and PA Henderly.  The patient underwent a paronychial incision and was placed on Keflex 500 mg twice a day.  The patient really presented to the emergency room March 12 with worsening pain.  At that time he was seen and a stab incision was made over the finger as it was suspicious for a felon according to the notes.  He has not had follow-up with hand surgery or any other provider other than the emergency room.  The patient and I have discussed this.  He was seen on March 12 by PA [facility] and Dr. Madilyn Hook.  Unfortunately the patient has not had any proper follow-up.  Unfortunately his finger is quite dramatically infected.  He presents with left index finger deep abscess.  This appears to be completely paronychial in origin.  He has fluctuance underneath his nail.  I discussed these issues with the patient and I discussed with him my concerns regarding the longstanding infection and the now evolving issue about his nailbed and osteomyelitic focus.  I discussed with the patient our concerns and the very significant issues as it is germane to his predicament.  It appears that he has had an abscess since the time of presentation March 7 and this is continued to fester.  He has a horrible infection notable today.  No past medical history on file.  No past surgical history on file.  No family history on file.  Social History:  reports that he has been smoking cigarettes. He has been smoking about 0.50 packs per day. He has never used smokeless tobacco. He reports previous drug use. He reports that he does not drink alcohol.  Allergies: No Known  Allergies  Medications: I have reviewed the patient's current medications.  Results for orders placed or performed during the hospital encounter of 11/01/19 (from the past 48 hour(s))  Respiratory Panel by RT PCR (Flu A&B, Covid) - Nasopharyngeal Swab     Status: None   Collection Time: 11/01/19  4:35 PM   Specimen: Nasopharyngeal Swab  Result Value Ref Range   SARS Coronavirus 2 by RT PCR NEGATIVE NEGATIVE    Comment: (NOTE) SARS-CoV-2 target nucleic acids are NOT DETECTED. The SARS-CoV-2 RNA is generally detectable in upper respiratoy specimens during the acute phase of infection. The lowest concentration of SARS-CoV-2 viral copies this assay can detect is 131 copies/mL. A negative result does not preclude SARS-Cov-2 infection and should not be used as the sole basis for treatment or other patient management decisions. A negative result may occur with  improper specimen collection/handling, submission of specimen other than nasopharyngeal swab, presence of viral mutation(s) within the areas targeted by this assay, and inadequate number of viral copies (<131 copies/mL). A negative result must be combined with clinical observations, patient history, and epidemiological information. The expected result is Negative. Fact Sheet for Patients:  https://www.moore.com/ Fact Sheet for Healthcare Providers:  https://www.young.biz/ This test is not yet ap proved or cleared by the [place] FDA and  has been authorized for detection and/or diagnosis of SARS-CoV-2 by FDA under an Emergency Use Authorization (EUA). This EUA will remain  in effect (meaning this test can be used) for the duration of the COVID-19 declaration under Section 564(b)(1) of the Act, 21 U.S.C. section 360bbb-3(b)(1), unless the authorization is terminated or revoked sooner.    Influenza A by PCR NEGATIVE NEGATIVE   Influenza B by PCR NEGATIVE NEGATIVE    Comment: (NOTE) The  Xpert Xpress SARS-CoV-2/FLU/RSV assay is intended as an aid in  the diagnosis of influenza from Nasopharyngeal swab specimens and  should not be used as a sole basis for treatment. Nasal washings and  aspirates are unacceptable for Xpert Xpress SARS-CoV-2/FLU/RSV  testing. Fact Sheet for Patients: PinkCheek.be Fact Sheet for Healthcare Providers: GravelBags.it This test is not yet approved or cleared by the [place] FDA and  has been authorized for detection and/or diagnosis of SARS-CoV-2 by  FDA under an Emergency Use Authorization (EUA). This EUA will remain  in effect (meaning this test can be used) for the duration of the  Covid-19 declaration under Section 564(b)(1) of the Act, 21  U.S.C. section 360bbb-3(b)(1), unless the authorization is  terminated or revoked. Performed at [facility], [place] [address] [place], [place] [address]   CBG monitoring, ED     Status: Abnormal   Collection Time: 11/01/19  4:44 PM  Result Value Ref Range   Glucose-Capillary 129 (H) 70 - 99 mg/dL    Comment: Glucose reference range applies only to samples taken after fasting for at least 8 hours.  Aerobic/Anaerobic Culture (surgical/deep wound)     Status: None (Preliminary result)   Collection Time: 11/01/19  6:08 PM   Specimen: Wound  Result Value Ref Range   Specimen Description WOUND RIGHT FINGER    Special Requests      NO ANAEROBIC SWAB SENT Performed at [facility], [place] [address], [place], [place] [address]    Gram Stain PENDING    Culture PENDING    Report Status PENDING     No results found.  Review of Systems  Respiratory: Negative.   Cardiovascular: Negative.   Allergic/Immunologic: Negative.   Neurological: Negative.    Blood pressure 140/90, pulse 87, temperature 99.2 F (37.3 C), resp. rate 16, height 5\' 11"  (1.803 m), weight 77.1 kg, SpO2 100 %. Physical Exam Patient has a significant deep  infection about his finger with soft tissue abnormality and foul smell.  His nail was lifted off and there is quite a bit of abnormal tissue throughout.  He will require an emergent irrigation and debridement.  I will obtain x-rays after the debridement and look for any evidence of osteomyelitis.  The patient is alert and oriented in no acute distress. The patient complains of pain in the affected upper extremity.  The patient is noted to have a normal HEENT exam. Lung fields show equal chest expansion and no shortness of breath. Abdomen exam is nontender without distention. Lower extremity examination does not show any fracture dislocation or blood clot symptoms. Pelvis is stable and the neck and back are stable and nontender. Assessment/Plan: Infection left index finger deep in nature.  I have consented the patient for irrigation debridement.  Procedure note.  Patient was seen and underwent a lidocaine block about the finger.  This was a flexor sheath block the patient tolerated this well he was prepped and draped prior to the block and following this underwent 2 separate Betadine scrub and paints.  Once this was complete I then performed a timeout and performed a irrigation and debridement of his finger I  removed his nail plate.  After removing the nail plate was quite apparent the nailbed had a resolution down to the bone and there was exposed bone with eburnation.  I cultured him for aerobic and anaerobic cultures and this was sent for pathology.  He was shown the area of bone involvement.  Unfortunately I feel this mandates [place] to treat him presumptively for osteomyelitis.  We will have x-rays taken and follow this along.  The area is 3 x 5 mm in its dimensions regarding the finger and the exposed bone.  I feel this is an unfortunate issue where a infection festered long enough to become chronic in nature which eburnated through the nailbed and expose the bone.  I discussed these issues with  the patient and my concerns regarding long-term viability of the bone and possible chronic osteomyelitic focus.  Given his findings today I am in a place him on 6 weeks of doxycycline 100 twice daily and await the cultures of course.  He tolerated the irrigation debridement nicely today.  Oxycodone was written for pain.  I will see him in office will begin daily whirlpools and dressing changes.  Should any problems occur he will notify me.  I will see him tomorrow at 1 PM in my office for whirlpool and daily dressing changes.  I would give him a variable prognosis in regards to the viability of his finger at this juncture given the very late presentation.  Dionne Ano Rahmel Nedved III 11/01/2019, 6:28 PM

## 2019-11-01 NOTE — ED Provider Notes (Signed)
[facility] EMERGENCY DEPARTMENT Provider Note   CSN: [id number] Arrival date & time: 11/01/19  1424     History Chief Complaint  Patient presents with  . Hand Pain    Dwayne Craig is a 59 y.o. male.  Patient presents to the ED with continued R index fingertip pain and swelling.  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  Patient was seen in the emergency department initially and had drainage of a paronychia.  He was placed on Keflex.  He returned to the emergency department with a felon and had this drained and was placed on Bactrim.  He has completed antibiotics and now presents with severe pain in his fingertip with worsening swelling.  He reports subjective fevers at home.  No history of diabetes that he is aware of.  No history of peripheral arterial disease.  Patient does not have a doctor.        No past medical history on file.  There are no problems to display for this patient.   No past surgical history on file.     No family history on file.  Social History   Tobacco Use  . Smoking status: Current Every Day Smoker    Packs/day: 0.50    Types: Cigarettes  . Smokeless tobacco: Never Used  Substance Use Topics  . Alcohol use: Never  . Drug use: Not Currently    Home Medications Prior to Admission medications   Medication Sig Start Date End Date Taking? Authorizing Provider  acetaminophen (TYLENOL) 500 MG tablet Take 500 mg by mouth every 6 (six) hours as needed for mild pain.    [provider]  ibuprofen (ADVIL) 200 MG tablet Take 200 mg by mouth every 6 (six) hours as needed for mild pain.    [provider]  Magnesium Sulfate, Laxative, (EPSOM SALT) GRAN Mix with warm water, soak hand 3 times daily for 20 minutes 10/21/19   Jeannie Fend, PA-C    Allergies    Patient has no known allergies.  Review of Systems    Review of Systems  Constitutional: Positive for activity change (States that he cannot work).  Musculoskeletal: Positive for arthralgias, joint swelling and myalgias. Negative for back pain, gait problem and neck pain.  Skin: Positive for wound (Healed).  Neurological: Negative for weakness and numbness.    Physical Exam Updated Vital Signs BP 140/90 (BP Location: Left Arm)   Pulse 87   Temp 99.2 F (37.3 C)   Resp 16   Ht 5\' 11"  (1.803 m)   Wt 77.1 kg   SpO2 100%   BMI 23.71 kg/m   Physical Exam Vitals and nursing note reviewed.  Constitutional:      Appearance: He is well-developed.  HENT:     Head: Normocephalic and atraumatic.  Eyes:     Conjunctiva/sclera: Conjunctivae normal.  Cardiovascular:     Pulses: Normal pulses. No decreased pulses.  Musculoskeletal:        General: Tenderness present.     Cervical back: Normal range of motion and neck supple.     Comments: Patient with tense swelling of the right index finger distal to the DIP.  Decreased range of motion at the DIP due to pain.  Skin:    General: Skin is warm and dry.  Neurological:     Mental Status: He is alert.  Sensory: No sensory deficit.     Comments: Motor, sensation, and vascular distal to the injury is fully intact.                ED Results / Procedures / Treatments   Labs (all labs ordered are listed, but only abnormal results are displayed) Labs Reviewed  CBG MONITORING, ED - Abnormal; Notable for the following components:      Result Value   Glucose-Capillary 129 (*)    All other components within normal limits  RESPIRATORY PANEL BY RT PCR (FLU A&B, COVID)    EKG None  Radiology No results found.  Procedures Procedures (including critical care time)  Medications Ordered in ED Medications  oxyCODONE (Oxy IR/ROXICODONE) immediate release tablet 5 mg (5 mg Oral Given 11/01/19 1648)    ED Course  I have reviewed the triage vital signs and the nursing notes.   Pertinent labs & imaging results that were available during my care of the patient were reviewed by me and considered in my medical decision making (see chart for details).  Patient seen and examined.  Patient with felon, this is his third ED visit.  He has failed I&D and outpatient antibiotic therapy.  Will touch base with orthopedic hand surgery and request consultation.  Vital signs reviewed and are as follows: BP 140/90 (BP Location: Left Arm)   Pulse 87   Temp 99.2 F (37.3 C)   Resp 16   Ht 5\' 11"  (1.803 m)   Wt 77.1 kg   SpO2 100%   BMI 23.71 kg/m   5:12 PM Dr. Amedeo Plenty to see later tonight.  Patient kept n.p.o. except for meds.  Covid testing ordered in case formal I&D in OR is warranted.  CBG 129.    MDM Rules/Calculators/A&P                      Felon, failure outpatient treatment, hand surgery to see.    Final Clinical Impression(s) / ED Diagnoses Final diagnoses:  Felon of finger of right hand    Rx / DC Orders ED Discharge Orders    None       Carlisle Cater, PA-C 11/01/19 1716    Veryl Speak, MD 11/01/19 [phone]

## 2019-11-01 NOTE — ED Notes (Signed)
Sign-pad unavailable upon discharge, pt provided with discharge instructions. verbalizes understanding. opportunity for questions and answers provided

## 2019-11-01 NOTE — ED Triage Notes (Signed)
Pt here for continued swelling and pain in R index finger. Reports taking all his Keflex in rx given last time.

## 2019-11-01 NOTE — Consult Note (Signed)
Reason for Consult:Right index finger infection Referring Physician: D Delo  Kean Gautreau is an 59 y.o. male.  HPI: Dwayne Craig comes in with a nearly 3w hx/o right index finger infection. He had a small injury with a wound to it towards the beginning of the month. This got infected and he came to the ED on 3/7 and had paronychia I&D and 5d Keflex. He returned 3/12 no better and had felon I&D and Septra x10d. He returns today no better. He has had some subjective fevers but no chills, sweats, N/V, or prior e/o. He is LHD.  No past medical history on file.  No past surgical history on file.  No family history on file.  Social History:  reports that he has been smoking cigarettes. He has been smoking about 0.50 packs per day. He has never used smokeless tobacco. He reports previous drug use. He reports that he does not drink alcohol.  Allergies: No Known Allergies  Medications: I have reviewed the patient's current medications.  No results found for this or any previous visit (from the past 48 hour(s)).  No results found.  Review of Systems  Constitutional: Negative for chills, diaphoresis and fever.  HENT: Negative for ear discharge, ear pain, hearing loss and tinnitus.   Eyes: Negative for photophobia and pain.  Respiratory: Negative for cough and shortness of breath.   Cardiovascular: Negative for chest pain.  Gastrointestinal: Negative for abdominal pain, nausea and vomiting.  Genitourinary: Negative for dysuria, flank pain, frequency and urgency.  Musculoskeletal: Positive for arthralgias (Right index finger). Negative for back pain, myalgias and neck pain.  Neurological: Negative for dizziness and headaches.  Hematological: Does not bruise/bleed easily.  Psychiatric/Behavioral: The patient is not nervous/anxious.    Blood pressure 140/90, pulse 87, temperature 99.2 F (37.3 C), resp. rate 16, height 5\' 11"  (1.803 m), weight 77.1 kg, SpO2 100 %. Physical Exam  Constitutional: He  appears well-developed and well-nourished. No distress.  HENT:  Head: Normocephalic and atraumatic.  Eyes: Conjunctivae are normal. Right eye exhibits no discharge. Left eye exhibits no discharge. No scleral icterus.  Cardiovascular: Normal rate and regular rhythm.  Respiratory: Effort normal. No respiratory distress.  Musculoskeletal:     Cervical back: Normal range of motion.     Comments: Right shoulder, elbow, wrist, digits- no skin wounds, index P3 swollen, severe TTP, not especially tense, some paresthesia ventral, no instability, no blocks to motion except pain  Sens  Ax/R/M/U intact  Mot   Ax/ R/ PIN/ M/ AIN/ U intact  Rad 2+  Neurological: He is alert.  Skin: Skin is warm and dry. He is not diaphoretic.  Psychiatric: He has a normal mood and affect. His behavior is normal.    Assessment/Plan: Right index finger infection -- Given I&D/abx failure x2 will probably need formal I&D in OR. Dr. to assess later tonight. Please keep NPO.    Amanda Pea, PA-C Orthopedic Surgery [phone] 11/01/2019, 3:59 PM

## 2019-11-01 NOTE — Discharge Instructions (Signed)
Please elevate your hand and keep your bandage clean and dry.  Please come to the office of Dr. Amanda Pea at 1 PM tomorrow.

## 2019-11-06 LAB — AEROBIC/ANAEROBIC CULTURE W GRAM STAIN (SURGICAL/DEEP WOUND)

## 2020-02-04 ENCOUNTER — Encounter (HOSPITAL_COMMUNITY): Payer: Self-pay | Admitting: *Deleted

## 2020-02-04 ENCOUNTER — Other Ambulatory Visit: Payer: Self-pay

## 2020-02-04 ENCOUNTER — Emergency Department (HOSPITAL_COMMUNITY)
Admission: EM | Admit: 2020-02-04 | Discharge: 2020-02-04 | Disposition: A | Discharge status: Home / Self Care | Payer: Medicaid Other | Attending: Emergency Medicine | Admitting: Emergency Medicine

## 2020-02-04 DIAGNOSIS — K098 Other cysts of oral region, not elsewhere classified: Secondary | ICD-10-CM | POA: Insufficient documentation

## 2020-02-04 DIAGNOSIS — L720 Epidermal cyst: Secondary | ICD-10-CM

## 2020-02-04 DIAGNOSIS — F17210 Nicotine dependence, cigarettes, uncomplicated: Secondary | ICD-10-CM | POA: Diagnosis not present

## 2020-02-04 DIAGNOSIS — R222 Localized swelling, mass and lump, trunk: Secondary | ICD-10-CM | POA: Diagnosis present

## 2020-02-04 NOTE — Discharge Instructions (Addendum)
You will need to get established with a primary care provider for ongoing evaluation and management of your suspected epidermal cyst/lipoma.  You may ultimately benefit from having the lesion biopsied to definitively diagnose the source of your bump.  Please call the [facility] to get established with a primary care provider as soon as possible for ongoing evaluation and management.   Your physical exam here today is reassuring.  Do not feel as though imaging or further work-up is warranted at this time.  However, please return to the ED or seek immediate medical attention should you experience any new or worsening symptoms.

## 2020-02-04 NOTE — ED Triage Notes (Signed)
The pt is c/o of back pain he just noticed a lump in his back tonight he does not know how long it been there

## 2020-02-04 NOTE — ED Provider Notes (Signed)
[facility] EMERGENCY DEPARTMENT Provider Note   CSN: [id number] Arrival date & time: 02/04/20  1938     History Chief Complaint  Patient presents with  . Back Pain    Dwayne Craig is a 59 y.o. male with no significant past medical history who presents the ED with complaints of a lump on his back.  He is unsure as to its chronicity.  He states that his wife noticed that the other day and so he wanted to come to the ED to get it checked out.  He denies any pain with it whatsoever.  He denies any fevers or chills, night sweats, history of cancer or IVDA, weakness or numbness, abdominal pain, or other complaints.  He was treated for a felon on his finger earlier in the year which he reports is healing well.  He does not have a primary care provider.  HPI     History reviewed. No pertinent past medical history.  There are no problems to display for this patient.   History reviewed. No pertinent surgical history.     No family history on file.  Social History   Tobacco Use  . Smoking status: Current Every Day Smoker    Packs/day: 0.50    Types: Cigarettes  . Smokeless tobacco: Never Used  Vaping Use  . Vaping Use: Never used  Substance Use Topics  . Alcohol use: Never  . Drug use: Not Currently    Home Medications Prior to Admission medications   Medication Sig Start Date End Date Taking? Authorizing Provider  acetaminophen (TYLENOL) 500 MG tablet Take 500 mg by mouth every 6 (six) hours as needed for mild pain.    [provider]  ibuprofen (ADVIL) 200 MG tablet Take 200 mg by mouth every 6 (six) hours as needed for mild pain.    [provider]  Magnesium Sulfate, Laxative, (EPSOM SALT) GRAN Mix with warm water, soak hand 3 times daily for 20 minutes 10/21/19   Suella Broad A, PA-C  oxyCODONE (ROXICODONE) 5 MG immediate release tablet Take 1 tablet (5 mg total) by mouth every 4 (four) hours as needed. 11/01/19 10/31/20  Roseanne Kaufman, MD    Allergies    Patient has no known allergies.  Review of Systems   Review of Systems  All other systems reviewed and are negative.   Physical Exam Updated Vital Signs BP 116/84   Pulse 98   Temp 98.3 F (36.8 C)   Resp 16   SpO2 96%   Physical Exam Vitals and nursing note reviewed. Exam conducted with a chaperone present.  Constitutional:      General: He is not in acute distress.    Appearance: Normal appearance. He is not ill-appearing.  HENT:     Head: Normocephalic and atraumatic.  Eyes:     General: No scleral icterus.    Conjunctiva/sclera: Conjunctivae normal.  Cardiovascular:     Rate and Rhythm: Normal rate and regular rhythm.     Pulses: Normal pulses.     Heart sounds: Normal heart sounds.  Pulmonary:     Effort: Pulmonary effort is normal.     Breath sounds: Normal breath sounds.  Musculoskeletal:     Cervical back: Normal range of motion. No rigidity.     Comments: 2.5 x 2.5 cm spherical, firm, mobile, discrete lesion in area of lower thoracic spine.  It is not fixed to the spine.  Does not feel to be irregular.  Nonerythematous.  Nontender to touch.  No surrounding induration.  No fluctuance.  Skin:    General: Skin is dry.     Capillary Refill: Capillary refill takes less than 2 seconds.  Neurological:     Mental Status: He is alert.     GCS: GCS eye subscore is 4. GCS verbal subscore is 5. GCS motor subscore is 6.     Comments: ROM and strength of lower extremities entirely intact.  Can ambulate without any difficulty.  Sensation intact throughout.  Psychiatric:        Mood and Affect: Mood normal.        Behavior: Behavior normal.        Thought Content: Thought content normal.           ED Results / Procedures / Treatments   Labs (all labs ordered are listed, but only abnormal results are displayed) Labs Reviewed - No data to display  EKG None  Radiology No results found.  Procedures Procedures (including critical  care time)  Medications Ordered in ED Medications - No data to display  ED Course  I have reviewed the triage vital signs and the nursing notes.  Pertinent labs & imaging results that were available during my care of the patient were reviewed by me and considered in my medical decision making (see chart for details).    MDM Rules/Calculators/A&P                          Patient's history and physical exam is consistent with a lipoma versus epidermoid cyst.  The lesion is spherical, firm, mobile, and discrete.  No irregularities.  Low suspicion for a tumor and do not feel as though imaging is warranted.  Instead, informed patient that he will need to have the lesion biopsied.  If it becomes bothersome, he may ultimately require surgical excision.  There is no erythema, warmth, or tenderness on my exam.  Low suspicion for abscess.  Please see pictures.  No red flag history or signs.  Do not feel as though further work-up or intervention is required.  Patient can be safely discharged with instructions to follow-up outpatient with a primary care provider.  He states that he does not yet have a primary care provider.  He does not have insurance despite working 2 jobs, I will refer him to [facility].  I emphasized the importance of having follow-up for this lesion and he replied that he will arrange for appropriate follow-up +/- biopsy.    Strict ED return precautions discussed. They were provided opportunity to ask any additional questions and have none at this time. They have expressed understanding of verbal discharge instructions as well as return precautions and are agreeable to the plan.    Final Clinical Impression(s) / ED Diagnoses Final diagnoses:  Epidermoid cyst    Rx / DC Orders ED Discharge Orders    None       Lorelee New, PA-C 02/04/20 2254    Raeford Razor, MD 02/05/20 1524

## 2020-04-25 ENCOUNTER — Emergency Department (HOSPITAL_COMMUNITY): Payer: Medicaid Other

## 2020-04-25 ENCOUNTER — Encounter (HOSPITAL_COMMUNITY): Payer: Self-pay | Admitting: *Deleted

## 2020-04-25 ENCOUNTER — Emergency Department (HOSPITAL_COMMUNITY)
Admission: EM | Admit: 2020-04-25 | Discharge: 2020-04-25 | Disposition: A | Discharge status: Home / Self Care | Payer: Medicaid Other | Attending: Emergency Medicine | Admitting: Emergency Medicine

## 2020-04-25 DIAGNOSIS — W228XXA Striking against or struck by other objects, initial encounter: Secondary | ICD-10-CM | POA: Insufficient documentation

## 2020-04-25 DIAGNOSIS — S60944A Unspecified superficial injury of right ring finger, initial encounter: Secondary | ICD-10-CM | POA: Diagnosis present

## 2020-04-25 DIAGNOSIS — Z5321 Procedure and treatment not carried out due to patient leaving prior to being seen by health care provider: Secondary | ICD-10-CM | POA: Diagnosis not present

## 2020-04-25 NOTE — ED Triage Notes (Signed)
Pt reports hitting right ring finger last week and still has swelling.

## 2020-04-25 NOTE — ED Notes (Signed)
Pt had to leave due to wait time.

## 2020-04-30 ENCOUNTER — Ambulatory Visit (INDEPENDENT_AMBULATORY_CARE_PROVIDER_SITE_OTHER): Payer: Medicaid Other

## 2020-04-30 ENCOUNTER — Other Ambulatory Visit: Payer: Self-pay

## 2020-04-30 ENCOUNTER — Encounter (HOSPITAL_COMMUNITY): Payer: Self-pay | Admitting: Emergency Medicine

## 2020-04-30 ENCOUNTER — Ambulatory Visit (HOSPITAL_COMMUNITY)
Admission: EM | Admit: 2020-04-30 | Discharge: 2020-04-30 | Disposition: A | Discharge status: Home / Self Care | Payer: Medicaid Other | Attending: Internal Medicine | Admitting: Internal Medicine

## 2020-04-30 DIAGNOSIS — S62634A Displaced fracture of distal phalanx of right ring finger, initial encounter for closed fracture: Secondary | ICD-10-CM

## 2020-04-30 DIAGNOSIS — M79644 Pain in right finger(s): Secondary | ICD-10-CM | POA: Diagnosis not present

## 2020-04-30 DIAGNOSIS — M7989 Other specified soft tissue disorders: Secondary | ICD-10-CM

## 2020-04-30 MED ORDER — IBUPROFEN 600 MG PO TABS: 600.0000 mg | ORAL_TABLET | Freq: Four times a day (QID) | ORAL | 0 refills | Status: AC | PRN

## 2020-04-30 NOTE — ED Triage Notes (Signed)
Right hand 4th finger has been swollen and painful for 2 weeks, he is unsure if he injured it.

## 2020-05-02 NOTE — ED Provider Notes (Signed)
[facility]    CSN: [id number] Arrival date & time: 04/30/20  1151      History   Chief Complaint Chief Complaint  Patient presents with   Finger Injury    HPI Dwayne Craig is a 59 y.o. male comes to the urgent care with a 2-week history of painful swollen right ring finger.  Swelling and pain has been persistent for 2 weeks.  No antecedent injury or heavy lifting.  Patient is a Investment banker, operational at [employer].  He denies any heavy lifting.  No erythema or discharge from the finger.  He is unable to make a fist.   HPI  History reviewed. No pertinent past medical history.  There are no problems to display for this patient.   History reviewed. No pertinent surgical history.     Home Medications    Prior to Admission medications   Medication Sig Start Date End Date Taking? Authorizing Provider  ibuprofen (ADVIL) 600 MG tablet Take 1 tablet (600 mg total) by mouth every 6 (six) hours as needed. 04/30/20   LampteyBritta Mccreedy, MD  Magnesium Sulfate, Laxative, (EPSOM SALT) GRAN Mix with warm water, soak hand 3 times daily for 20 minutes 10/21/19   Army Melia A, PA-C  oxyCODONE (ROXICODONE) 5 MG immediate release tablet Take 1 tablet (5 mg total) by mouth every 4 (four) hours as needed. 11/01/19 10/31/20  Dominica Severin, MD    Family History No family history on file.  Social History Social History   Tobacco Use   Smoking status: Current Every Day Smoker    Packs/day: 0.50    Types: Cigarettes   Smokeless tobacco: Never Used  Vaping Use   Vaping Use: Never used  Substance Use Topics   Alcohol use: Never   Drug use: Not Currently     Allergies   Patient has no known allergies.   Review of Systems Review of Systems  Gastrointestinal: Negative.   Musculoskeletal: Positive for arthralgias and joint swelling. Negative for myalgias, neck pain and neck stiffness.  Skin: Negative.   Neurological: Negative.      Physical Exam Triage Vital Signs ED Triage Vitals   Enc Vitals Group     BP 04/30/20 1421 123/85     Pulse Rate 04/30/20 1421 (!) 51     Resp 04/30/20 1421 16     Temp 04/30/20 1421 98.4 F (36.9 C)     Temp Source 04/30/20 1421 Oral     SpO2 04/30/20 1421 100 %     Weight --      Height --      Head Circumference --      Peak Flow --      Pain Score 04/30/20 1419 6     Pain Loc --      Pain Edu? --      Excl. in GC? --    No data found.  Updated Vital Signs BP 123/85    Pulse (!) 51    Temp 98.4 F (36.9 C) (Oral)    Resp 16    SpO2 100%   Visual Acuity Right Eye Distance:   Left Eye Distance:   Bilateral Distance:    Right Eye Near:   Left Eye Near:    Bilateral Near:     Physical Exam Cardiovascular:     Rate and Rhythm: Normal rate and regular rhythm.  Musculoskeletal:     Comments: Tender swelling of the right ring finger.  There is a fixed flexion deformity  around the PIP and DIP joints.  No erythema.  No discharge.  Skin is intact.  Skin:    General: Skin is warm.     Capillary Refill: Capillary refill takes less than 2 seconds.      UC Treatments / Results  Labs (all labs ordered are listed, but only abnormal results are displayed) Labs Reviewed - No data to display  EKG   Radiology No results found.  Procedures Procedures (including critical care time)  Medications Ordered in UC Medications - No data to display  Initial Impression / Assessment and Plan / UC Course  I have reviewed the triage vital signs and the nursing notes.  Pertinent labs & imaging results that were available during my care of the patient were reviewed by me and considered in my medical decision making (see chart for details).     1.. Right fourth and fifth finger fractures: I spoke with the orthopedic surgeon on-call who recommended follow-up in the office in 1 week Ibuprofen 600 mg every 6 hours as needed Return precautions given. Final Clinical Impressions(s) / UC Diagnoses   Final diagnoses:  Closed displaced  fracture of distal phalanx of right ring finger, initial encounter   Discharge Instructions   None    ED Prescriptions    Medication Sig Dispense Auth. Provider   ibuprofen (ADVIL) 600 MG tablet Take 1 tablet (600 mg total) by mouth every 6 (six) hours as needed. 30 tablet Tniya Bowditch, Britta Mccreedy, MD     PDMP not reviewed this encounter.   Merrilee Jansky, MD 05/02/20 [phone]

## 2021-12-04 ENCOUNTER — Emergency Department (HOSPITAL_COMMUNITY)
Admission: EM | Admit: 2021-12-04 | Discharge: 2021-12-04 | Disposition: A | Discharge status: Home / Self Care | Payer: Medicaid Other | Attending: Emergency Medicine | Admitting: Emergency Medicine

## 2021-12-04 ENCOUNTER — Other Ambulatory Visit: Payer: Self-pay

## 2021-12-04 DIAGNOSIS — F4389 Other reactions to severe stress: Secondary | ICD-10-CM | POA: Insufficient documentation

## 2021-12-04 DIAGNOSIS — F1490 Cocaine use, unspecified, uncomplicated: Secondary | ICD-10-CM | POA: Diagnosis not present

## 2021-12-04 DIAGNOSIS — Z634 Disappearance and death of family member: Secondary | ICD-10-CM | POA: Insufficient documentation

## 2021-12-04 DIAGNOSIS — F1410 Cocaine abuse, uncomplicated: Secondary | ICD-10-CM

## 2021-12-04 DIAGNOSIS — F4320 Adjustment disorder, unspecified: Secondary | ICD-10-CM | POA: Diagnosis present

## 2021-12-04 DIAGNOSIS — Z59819 Housing instability, housed unspecified: Secondary | ICD-10-CM

## 2021-12-04 DIAGNOSIS — F430 Acute stress reaction: Secondary | ICD-10-CM

## 2021-12-04 DIAGNOSIS — F1994 Other psychoactive substance use, unspecified with psychoactive substance-induced mood disorder: Secondary | ICD-10-CM

## 2021-12-04 LAB — COMPREHENSIVE METABOLIC PANEL
ALT: 23 U/L (ref 0–44)
AST: 28 U/L (ref 15–41)
Albumin: 3.4 g/dL — ABNORMAL LOW (ref 3.5–5.0)
Alkaline Phosphatase: 78 U/L (ref 38–126)
Anion gap: 7 (ref 5–15)
BUN: 12 mg/dL (ref 6–20)
CO2: 27 mmol/L (ref 22–32)
Calcium: 8.9 mg/dL (ref 8.9–10.3)
Chloride: 104 mmol/L (ref 98–111)
Creatinine, Ser: 1.19 mg/dL (ref 0.61–1.24)
GFR, Estimated: >60 mL/min (ref >60)
Glucose, Bld: 115 mg/dL — ABNORMAL HIGH (ref 70–99)
Potassium: 3.5 mmol/L (ref 3.5–5.1)
Sodium: 138 mmol/L (ref 135–145)
Total Bilirubin: 0.3 mg/dL (ref 0.3–1.2)
Total Protein: 6.2 g/dL — ABNORMAL LOW (ref 6.5–8.1)

## 2021-12-04 LAB — URINALYSIS, ROUTINE W REFLEX MICROSCOPIC
Bilirubin Urine: NEGATIVE
Glucose, UA: NEGATIVE mg/dL
Hgb urine dipstick: NEGATIVE
Ketones, ur: NEGATIVE mg/dL
Leukocytes,Ua: NEGATIVE
Nitrite: NEGATIVE
Protein, ur: NEGATIVE mg/dL
Specific Gravity, Urine: 1.024 (ref 1.005–1.030)
pH: 6 (ref 5.0–8.0)

## 2021-12-04 LAB — SALICYLATE LEVEL: Salicylate Lvl: <7 mg/dL — ABNORMAL LOW (ref 7.0–30.0)

## 2021-12-04 LAB — CBC WITH DIFFERENTIAL/PLATELET
Abs Immature Granulocytes: 0.01 10*3/uL (ref 0.00–0.07)
Basophils Absolute: 0 10*3/uL (ref 0.0–0.1)
Basophils Relative: 0 %
Eosinophils Absolute: 0.1 10*3/uL (ref 0.0–0.5)
Eosinophils Relative: 2 %
HCT: 40.3 % (ref 39.0–52.0)
Hemoglobin: 13.4 g/dL (ref 13.0–17.0)
Immature Granulocytes: 0 %
Lymphocytes Relative: 56 %
Lymphs Abs: 2.7 10*3/uL (ref 0.7–4.0)
MCH: 30.9 pg (ref 26.0–34.0)
MCHC: 33.3 g/dL (ref 30.0–36.0)
MCV: 92.9 fL (ref 80.0–100.0)
Monocytes Absolute: 0.5 10*3/uL (ref 0.1–1.0)
Monocytes Relative: 11 %
Neutro Abs: 1.5 10*3/uL — ABNORMAL LOW (ref 1.7–7.7)
Neutrophils Relative %: 31 %
Platelets: 267 10*3/uL (ref 150–400)
RBC: 4.34 MIL/uL (ref 4.22–5.81)
RDW: 14.9 % (ref 11.5–15.5)
WBC: 4.8 10*3/uL (ref 4.0–10.5)
nRBC: 0 % (ref 0.0–0.2)

## 2021-12-04 LAB — RAPID URINE DRUG SCREEN, HOSP PERFORMED
Amphetamines: NOT DETECTED
Barbiturates: NOT DETECTED
Benzodiazepines: NOT DETECTED
Cocaine: POSITIVE — AB
Opiates: NOT DETECTED
Tetrahydrocannabinol: POSITIVE — AB

## 2021-12-04 LAB — ACETAMINOPHEN LEVEL: Acetaminophen (Tylenol), Serum: <10 ug/mL — ABNORMAL LOW (ref 10–30)

## 2021-12-04 LAB — ETHANOL: Alcohol, Ethyl (B): <10 mg/dL (ref <10)

## 2021-12-04 NOTE — ED Triage Notes (Signed)
Pt here for feeling depressed x2 months w/ some anxiety after losing his apartment, his wife being in the hospital since February and the 1 yr anniversary of his son's death. Pt reports he doesn't know what to do, endorses a lot of stress. Pt has no psych hx, denies SI/HI ?

## 2021-12-04 NOTE — ED Provider Notes (Signed)
?[facility] EMERGENCY DEPARTMENT ?Provider Note ? ? ?CSN: [id number] ?Arrival date & time: 12/04/21  0200 ? ?  ? ?History ? ?Chief Complaint  ?Patient presents with  ? Homeless  ? Depression  ? ? ?Dwayne Craig is a 61 y.o. male who presents today for evaluation of feeling like his mind is running. ?He states that he has had multiple stressors recently including his wife being hospitalized for multiple months during that time she had an amputation, 2 strokes, and became blind per his report.  He states that he is having multiple social/financial difficulties as a result of this and is facing eviction. ?When I ask him what brings him into the emergency room tonight he states that he was walking from his wife's nursing facility home and felt like he was not safe and that someone was going to murder him however he did not have any specifics beyond that. ?Patient additionally tells me that a year ago he had encounters with spirits and demons.  These reportedly inhabited his 77 year old son's body and would do things such as make objects move, turn on faucets and flush toilets.  He states that these demons followed them even when they moved into a hotel.  He states that as long as he does not give his son his ADHD medication the demons do not inhabit his son's body.  He states his son is currently at his aunts house and safe.  ? ?Patient states that he last used cocaine 4 days ago.  He denies alcohol use.  He denies specific SI or HI. ? ? ?HPI ? ?  ? ?Home Medications ?Prior to Admission medications   ?Medication Sig Start Date End Date Taking? Authorizing Provider  ?ibuprofen (ADVIL) 600 MG tablet Take 1 tablet (600 mg total) by mouth every 6 (six) hours as needed. ?Patient not taking: Reported on 12/04/2021 04/30/20   Merrilee Jansky, MD  ?Magnesium Sulfate, Laxative, (EPSOM SALT) GRAN Mix with warm water, soak hand 3 times daily for 20 minutes ?Patient not taking: Reported on 12/04/2021 10/21/19    Jeannie Fend, PA-C  ?   ? ?Allergies    ?Patient has no known allergies.   ? ?Review of Systems   ?Review of Systems ? ?Physical Exam ?Updated Vital Signs ?BP 126/81 (BP Location: Right Arm)   Pulse 69   Temp 98.3 ?F (36.8 ?C) (Oral)   Resp 16   SpO2 98%  ?Physical Exam ?Vitals and nursing note reviewed.  ?Constitutional:   ?   General: He is not in acute distress. ?   Appearance: He is not diaphoretic.  ?HENT:  ?   Head: Normocephalic and atraumatic.  ?Eyes:  ?   General: No scleral icterus.    ?   Right eye: No discharge.     ?   Left eye: No discharge.  ?   Conjunctiva/sclera: Conjunctivae normal.  ?Cardiovascular:  ?   Rate and Rhythm: Normal rate and regular rhythm.  ?Pulmonary:  ?   Effort: Pulmonary effort is normal. No respiratory distress.  ?   Breath sounds: No stridor.  ?Abdominal:  ?   General: There is no distension.  ?Musculoskeletal:     ?   General: No deformity.  ?   Cervical back: Normal range of motion.  ?Skin: ?   General: Skin is warm and dry.  ?Neurological:  ?   Mental Status: He is alert.  ?   Motor: No abnormal muscle tone.  ?  Comments: Patient is awake and alert. Speech is not slurred.   ?Psychiatric:  ?   Comments: Patient has rapid pressured speech with tangential thought pattern.  He denies SI, HI.  He reports to seeing spirits and demons.   ? ? ?ED Results / Procedures / Treatments   ?Labs ?(all labs ordered are listed, but only abnormal results are displayed) ?Labs Reviewed  ?COMPREHENSIVE METABOLIC PANEL - Abnormal; Notable for the following components:  ?    Result Value  ? Glucose, Bld 115 (*)   ? Total Protein 6.2 (*)   ? Albumin 3.4 (*)   ? All other components within normal limits  ?RAPID URINE DRUG SCREEN, HOSP PERFORMED - Abnormal; Notable for the following components:  ? Cocaine POSITIVE (*)   ? Tetrahydrocannabinol POSITIVE (*)   ? All other components within normal limits  ?CBC WITH DIFFERENTIAL/PLATELET - Abnormal; Notable for the following components:  ? Neutro  Abs 1.5 (*)   ? All other components within normal limits  ?ACETAMINOPHEN LEVEL - Abnormal; Notable for the following components:  ? Acetaminophen (Tylenol), Serum <10 (*)   ? All other components within normal limits  ?SALICYLATE LEVEL - Abnormal; Notable for the following components:  ? Salicylate Lvl <7.0 (*)   ? All other components within normal limits  ?ETHANOL  ?URINALYSIS, ROUTINE W REFLEX MICROSCOPIC  ? ? ?EKG ?None ? ?Radiology ?No results found. ? ?Procedures ?Procedures  ? ? ?Medications Ordered in ED ?Medications - No data to display ? ?ED Course/ Medical Decision Making/ A&P ?  ?                        ?Medical Decision Making ?Patient is a 61 year old male who presents today for multiple concerns. ?He has multiple social concerns including being faced with infection, difficulty paying bills, and difficulty with stress related to his wife's recent illness and hospitalizations while attempting to raise his 71 year old son and coming up on the 1 year anniversary of when his son who was in his 30s shot himself in the head per patient report. ? ?While I am speaking with the patient in addition to these multiple social stressors he denies SI or HI, however states that he felt unsafe walking home as he was concerned someone was going to murder him or harm him.  He feels like he is always looking over his shoulder.  He also reports that he will see spirits and demons.  He has seen them make things move. ? ?He states he has seen these possessed his 57 year old son when his son was taking his ADHD medications.  He states that currently his son is not possessed, has not been possessed for over a year since they stopped the ADHD medications and his son is currently safe with the aunt. ? ?Given patient's issues seem more than just social issues TTS is consulted. ? ?Patient is medically clear for psychiatric evaluation and disposition. ? ?Amount and/or Complexity of Data Reviewed ?Labs: ordered. ?   Details:  Ethanol is undetected, CBC and CMP are unremarkable.  Acetaminophen and salicylates are undetected.  UDS is positive for cocaine and THC. ? ? ? ? ? ?Final Clinical Impression(s) / ED Diagnoses ?Final diagnoses:  ?Cocaine use  ?Adjustment disorder, unspecified type  ?Other social stressor  ?Grief at loss of child  ? ? ?Rx / DC Orders ?ED Discharge Orders   ? ? None  ? ?  ? ? ?  ?Lyndel Safe  W, PA-C ?12/04/21 0539 ? ?  ?Zadie RhineWickline, Donald, MD ?12/04/21 [phone] ? ?

## 2021-12-04 NOTE — BH Assessment (Addendum)
Comprehensive Clinical Assessment (CCA) Note  12/04/2021 Dwayne Craig [id number] Disposition: Clinician discussed patient care with Nira Conn, FNP.  He recommends patient be observed and seen by psychiatry and have a Child psychotherapist see pt w/ resources.  Clinician informed Dr. Bebe Shaggy and RN Armond Hang of disposition recommendation via secure messaging.    Pt has good eye contact and is oriented x4.  He is clear and coherent, if a bit delusional.  Pt has paranoia and is clear about his need for assistance.  Pt hears footsteps in an empty apartment.  He feels people may be after him.  Pt thinks 60 year old son has demons.  Pt cannot sleep due to paranoia.  He does not have steady access to food.    Pt has no outpatient providers.   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 No data recorded What Is the Reason for Your Visit/Call Today? Pt says he is stressing really bad.  His wife is in bad health and is currently at [facility] in rehab due to her right leg amputation due to DM.  Wife is blind also.  Pt owes $7,000 in back rent in the apartment and he owes $749 to [employer] and the power has been cut off a week ago.  He has an 68 year old son who is staying with his maternal aunt.  Pt says that his 68 year old son has "uninvited demons" in him.  The boy has been possessed for about 1.5 years.  Evidence of this was that faucets turned on and toilet flushed by itself.  He says that the demons have something to do with 65 year old son's ADHD.  Patient says that if his son does not get any of his ADHD then the demons will go away.  Pt says son has not been medicated since staying with aunt for last few months.  Pt says he has a sense of fear about whether someone is  going to hurt him.  He tells about his older son who committed suicide a year ago at age 73.  That son shot himself int he head.  Pt cites this, wife's health and son's demons as reasons for his relapsing on crack cocaine.  He says he relapsed 4 days ago.  Pt says "I ain't trying to kill myself."  Pt denies any HI.  He hears footsteps in the apartment.  He says he is under a lot of pressure.  Pt feels paranoid and gets little sleep.  How Long Has This Been Causing You Problems? > than 6 months  What Do You Feel Would Help You the Most Today? Treatment for Depression or other mood problem; Stress Management; Housing Assistance; Financial Resources   Have You Recently Had Any Thoughts About Hurting Yourself? No  Are You Planning to Commit Suicide/Harm Yourself At This time? No   Have you Recently Had Thoughts About Hurting Someone Karolee Ohs? No  Are You Planning to Harm Someone at This Time? No  Explanation: No data recorded  Have You Used Any Alcohol or Drugs in the Past 24 Hours? No  How Long Ago Did You Use Drugs or Alcohol? No data recorded What Did You Use and How Much? No data recorded  Do You  Currently Have a Therapist/Psychiatrist? No data recorded Name of Therapist/Psychiatrist: No data recorded  Have You Been Recently Discharged From Any Office Practice or Programs? No  Explanation of Discharge From Practice/Program: No data recorded    CCA Screening Triage Referral Assessment Type of Contact: Tele-Assessment  Telemedicine Service Delivery:   Is this Initial or Reassessment? Initial Assessment  Date Telepsych consult ordered in [facility]:  12/04/21  Time Telepsych consult ordered in [facility]:  0351  Location [facility] Assessment[facility] ED  Provider Location: [facility] Assessment Services   Collateral Involvement: No data recorded  Does Patient Have a Court Appointed Legal Guardian? No data recorded Name and Contact of Legal Guardian: No data recorded If Minor and Not Living with  Parent(s), Who has Custody? No data recorded Is CPS involved or ever been involved? Never  Is APS involved or ever been involved? Never   Patient Determined To Be At Risk for Harm To Self or Others Based on Review of Patient Reported Information or Presenting Complaint? No  Method: No data recorded Availability of Means: No data recorded Intent: No data recorded Notification Required: No data recorded Additional Information for Danger to Others Potential: No data recorded Additional Comments for Danger to Others Potential: No data recorded Are There Guns or Other Weapons in Your Home? No data recorded Types of Guns/Weapons: No data recorded Are These Weapons Safely Secured?                            No data recorded Who Could Verify You Are Able To Have These Secured: No data recorded Do You Have any Outstanding Charges, Pending Court Dates, Parole/Probation? No data recorded Contacted To Inform of Risk of Harm To Self or Others: No data recorded   Does Patient Present under Involuntary Commitment? No  IVC Papers Initial File Date: No data recorded  [place] of Residence: [place]   Patient Currently Receiving the Following Services: Not Receiving Services   Determination of Need: Urgent (48 hours)   Options For Referral: Other: Comment (Observe in ED and have psychiatry and social work review.)     CCA Biopsychosocial Patient Reported Schizophrenia/Schizoaffective Diagnosis in Past: No   Strengths: No data recorded  Mental Health Symptoms Depression:   Difficulty Concentrating; Sleep (too much or little); Increase/decrease in appetite; Hopelessness   Duration of Depressive symptoms:  Duration of Depressive Symptoms: Greater than two weeks   Mania:  No data recorded  Anxiety:    Difficulty concentrating; Restlessness; Sleep; Tension; Worrying   Psychosis:   Hallucinations   Duration of Psychotic symptoms:  Duration of Psychotic Symptoms: Greater than six  months   Trauma:   None   Obsessions:   None   Compulsions:   None   Inattention:   None   Hyperactivity/Impulsivity:   None   Oppositional/Defiant Behaviors:   None   Emotional Irregularity:   Transient, stress-related paranoia/disassociation   Other Mood/Personality Symptoms:  No data recorded   Mental Status Exam Appearance and self-care  Stature:   Tall   Weight:   Average weight   Clothing:   Disheveled   Grooming:   Neglected   Cosmetic use:   None   Posture/gait:   Normal   Motor activity:   Not Remarkable   Sensorium  Attention:   Normal   Concentration:   Preoccupied   Orientation:   X5   Recall/memory:   Normal   Affect and Mood  Affect:  No data recorded  Mood:  No data recorded  Relating  Eye contact:  No data recorded  Facial expression:  No data recorded  Attitude toward examiner:  No data recorded  Thought and Language  Speech flow: No data recorded  Thought content:  No data recorded  Preoccupation:  No data recorded  Hallucinations:  No data recorded  Organization:  No data recorded  [employer] of Knowledge:  No data recorded  Intelligence:  No data recorded  Abstraction:  No data recorded  Judgement:  No data recorded  Reality Testing:  No data recorded  Insight:  No data recorded  Decision Making:  No data recorded  Social Functioning  Social Maturity:  No data recorded  Social Judgement:  No data recorded  Stress  Stressors:  No data recorded  Coping Ability:  No data recorded  Skill Deficits:  No data recorded  Supports:  No data recorded    Religion:    Leisure/Recreation:    Exercise/Diet:     CCA Employment/Education Employment/Work Situation: Employment / Work Situation Employment Situation: Unemployed Has Patient ever Been in Equities trader?: No  Education: Education Is Patient Currently Attending School?: No Last Grade Completed: 11 Did You Product manager?:  No   CCA Family/Childhood History Family and Relationship History: Family history Marital status: Married Does patient have children?: Yes How many children?: 4  Childhood History:  Childhood History By whom was/is the patient raised?: Both parents Did patient suffer any verbal/emotional/physical/sexual abuse as a child?: No Did patient suffer from severe childhood neglect?: No Has patient ever been sexually abused/assaulted/raped as an adolescent or adult?: No Witnessed domestic violence?: No Has patient been affected by domestic violence as an adult?: No  Child/Adolescent Assessment:     CCA Substance Use Alcohol/Drug Use: Alcohol / Drug Use Pain Medications: None Prescriptions: None Over the Counter: None History of alcohol / drug use?: Yes Substance #1 Name of Substance 1: Cocaine 1 - Age of First Use: In his 20's 1 - Amount (size/oz): Varies 1 - Frequency: Used for the first time four days ago.  First time in an undetermined amount of time. 1 - Duration: off and on 1 - Last Use / Amount: Four days ago. 1 - Method of Aquiring: illegal activity 1- Route of Use: smoking                       ASAM's:  Six Dimensions of Multidimensional Assessment  Dimension 1:  Acute Intoxication and/or Withdrawal Potential:      Dimension 2:  Biomedical Conditions and Complications:      Dimension 3:  Emotional, Behavioral, or Cognitive Conditions and Complications:     Dimension 4:  Readiness to Change:     Dimension 5:  Relapse, Continued use, or Continued Problem Potential:     Dimension 6:  Recovery/Living Environment:     ASAM Severity Score:    ASAM Recommended Level of Treatment:     Substance use Disorder (SUD)    Recommendations for Services/Supports/Treatments:    Discharge Disposition:    DSM5 Diagnoses: There are no problems to display for this patient.    Referrals to Alternative Service(s): Referred to Alternative Service(s):   Place:    Date:   Time:    Referred to Alternative Service(s):   Place:   Date:   Time:    Referred to Alternative Service(s):   Place:   Date:   Time:  Referred to Alternative Service(s):   Place:   Date:   Time:     Wandra Mannan

## 2021-12-04 NOTE — ED Notes (Signed)
Pt requesting to "smoke a cigarette". Pt is not IVC. Pt in scrubs but does have all belongings. Pt informed smoke free campus and if pt leaves unit he will have to sign back in. MD Plunkett notified and confirmed pt not IVC. At this time pt waiting on social work consult and psychiatry.  ?

## 2021-12-04 NOTE — ED Provider Notes (Signed)
Emergency Medicine Observation Re-evaluation Note ? ?Dwayne Craig is a 61 y.o. male, seen on rounds today.  Pt initially presented to the ED for complaints of stress related to housing stress, and related feelings of anxiety and depression. States he had been separated from spouse, was living in [place], but then came back to help her with her health issues (she is in rehab center), and states rent is behind.  He indicates he is not POA on the lease, and that spouse/sister in law is responsible party, but is concerned as upcoming court issues.  ? ?Physical Exam  ?BP 121/73   Pulse 69   Temp 98.3 ?F (36.8 ?C) (Oral)   Resp 18   SpO2 98%  ?Physical Exam ?General: alert, content, no distress.  ?Cardiac: regular rate.  ?Lungs: breathing comfortably. ?Psych: alert, content, conversant, pleasant. Pt exhibits normal mood and affect. He does not appear acutely depressed or despondent, but does acknowledge stress related to housing issues, back rent, and potential looming housing instability. No SI/HI. Pt is not responding to internal stimuli - no delusions or hallucinations noted.  ? ?ED Course / MDM  ? ? ?I have reviewed the labs performed to date as well as medications administered while in observation.  Recent changes in the last 24 hours include ED obs, reassessment.  ? ?Plan  ? ? Shubh Chiara is not under involuntary commitment. ? ?Patient with normal mood/affect. No SI/HI. No acute psychosis.  ? ?Will provide resource guide re social services in community.  ? ?Also feel would benefit from outpatient pcp and bh f/u - will provide resource guide for that as well. UDS + cocaine and THC -  will provide substance use program resources also.  ? ?Pt currently appears stable for d/c.  ? ?Return precautions provided.  ? ? ?  ?Cathren Laine, MD ?12/04/21 [phone] ? ?

## 2021-12-04 NOTE — ED Notes (Signed)
Breakfast order placed ?

## 2021-12-04 NOTE — Discharge Instructions (Addendum)
It was our pleasure to provide your ER care today - we hope that you feel better. ? ?Overall, your lab tests look good. ? ?Drink plenty of fluids/stay well hydrated. Avoid substance use, as it is harmful to your physical health and mental well-being. See resource guide provided in terms of substance use treatment programs in the area.  ? ?Follow up closely with primary care doctor and behavioral health provider in the next 1-2 weeks - see attached resources for places to follow up.  ? ?For mental health issues and/or crisis, you may also go to the [facility] - it is open 24/7 and walk-ins are welcome.  ? ?Return to ER if worse, new symptoms, fevers, chest pain, trouble breathing, or other concern.  ?

## 2021-12-04 NOTE — ED Notes (Signed)
ED Provider at bedside. 

## 2021-12-04 NOTE — BH Assessment (Signed)
Clinician messaged pt's team at 0400 in an effort to complete pt's MH Assessment. At 0510 clinician again messaged pt's team requesting they let herself or her TTS co-workers know when pt is ready to be seen. ?

## 2021-12-16 ENCOUNTER — Other Ambulatory Visit: Payer: Self-pay

## 2021-12-16 ENCOUNTER — Encounter (HOSPITAL_COMMUNITY): Payer: Self-pay | Admitting: Emergency Medicine

## 2021-12-16 ENCOUNTER — Emergency Department (HOSPITAL_COMMUNITY): Payer: Medicaid Other

## 2021-12-16 ENCOUNTER — Emergency Department (HOSPITAL_COMMUNITY)
Admission: EM | Admit: 2021-12-16 | Discharge: 2021-12-17 | Disposition: A | Discharge status: Home / Self Care | Payer: Medicaid Other | Attending: Emergency Medicine | Admitting: Emergency Medicine

## 2021-12-16 DIAGNOSIS — S80211A Abrasion, right knee, initial encounter: Secondary | ICD-10-CM | POA: Diagnosis not present

## 2021-12-16 DIAGNOSIS — S8991XA Unspecified injury of right lower leg, initial encounter: Secondary | ICD-10-CM | POA: Diagnosis present

## 2021-12-16 DIAGNOSIS — W1839XA Other fall on same level, initial encounter: Secondary | ICD-10-CM | POA: Insufficient documentation

## 2021-12-16 DIAGNOSIS — T148XXA Other injury of unspecified body region, initial encounter: Secondary | ICD-10-CM

## 2021-12-16 DIAGNOSIS — M25561 Pain in right knee: Secondary | ICD-10-CM

## 2021-12-16 DIAGNOSIS — Y9302 Activity, running: Secondary | ICD-10-CM | POA: Insufficient documentation

## 2021-12-16 NOTE — ED Triage Notes (Signed)
Pt reported to ED with c/o injury to right knee after running from dog and falling three days ago. States he busted knee open and knee hurts to walk on it. States "I need some ointment to put on it because I don't want it to get infected" Open area to knee noted.  ?

## 2021-12-17 ENCOUNTER — Emergency Department (HOSPITAL_COMMUNITY): Payer: Medicaid Other

## 2021-12-17 MED ORDER — NAPROXEN 250 MG PO TABS
500.0000 mg | ORAL_TABLET | Freq: Once | ORAL | Status: AC
  Administered 2021-12-17: 500 mg via ORAL
  Filled 2021-12-17: qty 2

## 2021-12-17 MED ORDER — HYDROCODONE-ACETAMINOPHEN 5-325 MG PO TABS
1.0000 | ORAL_TABLET | Freq: Once | ORAL | Status: AC
  Administered 2021-12-17: 1 via ORAL
  Filled 2021-12-17: qty 1

## 2021-12-17 MED ORDER — BACITRACIN ZINC 500 UNIT/GM EX OINT
TOPICAL_OINTMENT | Freq: Two times a day (BID) | CUTANEOUS | Status: DC
  Administered 2021-12-17: 1 via TOPICAL
  Filled 2021-12-17: qty 0.9

## 2021-12-17 NOTE — ED Notes (Signed)
  Pt transported to ct 

## 2021-12-17 NOTE — ED Notes (Signed)
Pt continues to c/o pain and wants more pain meds. Provider aware at this time ?

## 2021-12-17 NOTE — ED Notes (Signed)
Provided pt with ice pack at this time ?

## 2021-12-17 NOTE — ED Notes (Signed)
Provider at bedside at this time

## 2021-12-17 NOTE — ED Notes (Signed)
Abrasion to rt knee from a fall while running from a dog. Was dressed with ointment, telfa dressing, and tape at this time per provider and pt request ?

## 2021-12-17 NOTE — Discharge Instructions (Signed)
You were seen today for knee pain.  You have a significant abrasion.  Apply antibiotic ointment.  Take ibuprofen or naproxen as needed for pain.  Keep iced and elevated. ?

## 2021-12-17 NOTE — ED Provider Notes (Signed)
?[facility] EMERGENCY DEPARTMENT ?Provider Note ? ? ?CSN: [id number] ?Arrival date & time: 12/16/21  2255 ? ?  ? ?History ? ?Chief Complaint  ?Patient presents with  ? Knee Injury  ? ? ?Dwayne Craig is a 61 y.o. male. ? ?HPI ? ?  ? ?This is a 61 year old male who presents following a knee injury.  Patient reports 3 days ago he was running from a dog when he fell and skinned his right knee.  He has reported increasing pain of the knee.  No redness or swelling.  No drainage.  He has not taken anything for his pain.  He has been ambulatory.  Denies other injury. ? ?Home Medications ?Prior to Admission medications   ?Medication Sig Start Date End Date Taking? Authorizing Provider  ?ibuprofen (ADVIL) 600 MG tablet Take 1 tablet (600 mg total) by mouth every 6 (six) hours as needed. ?Patient not taking: Reported on 12/04/2021 04/30/20   Merrilee Jansky, MD  ?Magnesium Sulfate, Laxative, (EPSOM SALT) GRAN Mix with warm water, soak hand 3 times daily for 20 minutes ?Patient not taking: Reported on 12/04/2021 10/21/19   Jeannie Fend, PA-C  ?   ? ?Allergies    ?Patient has no known allergies.   ? ?Review of Systems   ?Review of Systems  ?Musculoskeletal:   ?     Knee pain  ?All other systems reviewed and are negative. ? ?Physical Exam ?Updated Vital Signs ?BP (!) 178/97 (BP Location: Right Arm)   Pulse 75   Temp (!) 97.5 ?F (36.4 ?C) (Oral)   Resp 18   Ht 1.803 m (5\' 11" )   Wt 74.8 kg   SpO2 97%   BMI 23.01 kg/m?  ?Physical Exam ?Vitals and nursing note reviewed.  ?Constitutional:   ?   Appearance: He is well-developed.  ?HENT:  ?   Head: Normocephalic and atraumatic.  ?   Mouth/Throat:  ?   Mouth: Mucous membranes are moist.  ?Eyes:  ?   Pupils: Pupils are equal, round, and reactive to light.  ?Cardiovascular:  ?   Rate and Rhythm: Normal rate and regular rhythm.  ?   Heart sounds: No murmur heard. ?Pulmonary:  ?   Effort: Pulmonary effort is normal. No respiratory distress.  ?Abdominal:  ?    Palpations: Abdomen is soft.  ?   Tenderness: There is no abdominal tenderness.  ?Musculoskeletal:  ?   Cervical back: Neck supple.  ?   Comments: Abrasion noted to the right knee, no adjacent erythema, no drainage, tenderness palpation proximal fibula, normal range of motion of the knee, no obvious deformities  ?Lymphadenopathy:  ?   Cervical: No cervical adenopathy.  ?Skin: ?   General: Skin is warm and dry.  ?Neurological:  ?   Mental Status: He is alert and oriented to person, place, and time.  ?Psychiatric:     ?   Mood and Affect: Mood normal.  ? ? ?ED Results / Procedures / Treatments   ?Labs ?(all labs ordered are listed, but only abnormal results are displayed) ?Labs Reviewed - No data to display ? ?EKG ?None ? ?Radiology ?CT Knee Right Wo Contrast ? ?Result Date: 12/17/2021 ?CLINICAL DATA:  Knee trauma, occult fracture suspected. EXAM: CT OF THE RIGHT KNEE WITHOUT CONTRAST TECHNIQUE: Multidetector CT imaging of the right knee was performed according to the standard protocol. Multiplanar CT image reconstructions were also generated. RADIATION DOSE REDUCTION: This exam was performed according to the departmental dose-optimization program which includes  automated exposure control, adjustment of the mA and/or kV according to patient size and/or use of iterative reconstruction technique. COMPARISON:  12/16/2021. FINDINGS: Bones/Joint/Cartilage No acute fracture or dislocation. There is a corticated bony density adjacent to the fibular head which is likely chronic. A small to moderate joint effusion is noted. There is tricompartmental mild joint space narrowing and subchondral sclerosis. Ligaments Suboptimally assessed by CT. Muscles and Tendons No significant abnormality. The patellar tendon and quadriceps tendon are within normal limits. Soft tissues Subcutaneous fat stranding is present anterior to the patella. Mild atherosclerotic calcification of the distal popliteal artery. IMPRESSION: 1. No acute fracture  or dislocation. Corticated bony density adjacent to the fibular head is likely chronic. 2. Small to moderate joint effusion. 3. Mild tricompartmental degenerative changes. Electronically Signed   By: Thornell SartoriusLaura  Taylor M.D.   On: 12/17/2021 01:44  ? ?DG Knee Complete 4 Views Right ? ?Result Date: 12/16/2021 ?CLINICAL DATA:  Fall and injury to knee. EXAM: RIGHT KNEE - COMPLETE 4+ VIEW COMPARISON:  None Available. FINDINGS: No evidence of fracture, dislocation, or joint effusion. A calcification is present adjacent to the head of the fibula with no donor site identified. No evidence of arthropathy or other focal bone abnormality. Soft tissues are unremarkable. IMPRESSION: No definite evidence of fracture. A bony density is seen adjacent to the head of the fibula with no donor site identified. Correlation with physical exam for point tenderness is recommended. The need for further evaluation with CT should be determined clinically. Electronically Signed   By: Thornell SartoriusLaura  Taylor M.D.   On: 12/16/2021 23:34   ? ?Procedures ?Procedures  ? ? ?Medications Ordered in ED ?Medications  ?bacitracin ointment (1 application. Topical Given 12/17/21 0126)  ?naproxen (NAPROSYN) tablet 500 mg (has no administration in time range)  ?HYDROcodone-acetaminophen (NORCO/VICODIN) 5-325 MG per tablet 1 tablet (1 tablet Oral Given 12/17/21 0125)  ? ? ?ED Course/ Medical Decision Making/ A&P ?  ?                        ?Medical Decision Making ?Amount and/or Complexity of Data Reviewed ?Radiology: ordered. ? ?Risk ?OTC drugs. ?Prescription drug management. ? ? ?This patient presents to the ED for concern of knee pain, this involves an extensive number of treatment options, and is a complaint that carries with it a high risk of complications and morbidity.  The differential diagnosis includes abrasion, contusion, fracture, sprain ? ?MDM:   ? ?This is a 61 year old male who presents with right knee pain.  3 days ago fell after running from a dog.  He is  nontoxic and vital signs are notable for blood pressure 178/97.  This may be pain related.  He has a significant abrasion to the knee without evidence of infection.  He is tender over the proximal fibular head.  X-rays show a cortical irregularity of unclear significance.  CT of the right knee was obtained to evaluate for acute fracture.  CT is negative for acute fracture.  Suspect pain is related to contusion and abrasion.  Recommend antibiotic ointment and ibuprofen or naproxen.  Keep iced and elevated. ?Admission considered for knee pain ?(Labs, imaging, consults) ? ?Labs: ?I Ordered, and personally interpreted labs.  The pertinent results include: None ? ?Imaging Studies ordered: ?I ordered imaging studies including x-ray right knee, CT scan right knee ?I independently visualized and interpreted imaging. ?I agree with the radiologist interpretation ? ?Additional history obtained from chart review.  External records from outside  source obtained and reviewed including evaluations ? ?Cardiac Monitoring: ?The patient was maintained on a cardiac monitor.  I personally viewed and interpreted the cardiac monitored which showed an underlying rhythm of: Sinus rhythm ? ?Reevaluation: ?After the interventions noted above, I reevaluated the patient and found that they have :improved ? ?Social Determinants of Health: ?lives independently ? ?Disposition:  d/c ? ?Co morbidities that complicate the patient evaluation ?History reviewed. No pertinent past medical history.  ? ?Medicines ?Meds ordered this encounter  ?Medications  ? HYDROcodone-acetaminophen (NORCO/VICODIN) 5-325 MG per tablet 1 tablet  ? bacitracin ointment  ? naproxen (NAPROSYN) tablet 500 mg  ?  ?I have reviewed the patients home medicines and have made adjustments as needed ? ?Problem List / ED Course: ?Problem List Items Addressed This Visit   ?None ?Visit Diagnoses   ? ? Acute pain of right knee    -  Primary  ? Abrasion      ? ?  ?   ? ? ? ? ? ? ? ? ? ? ? ? ?Final Clinical Impression(s) / ED Diagnoses ?Final diagnoses:  ?Acute pain of right knee  ?Abrasion  ? ? ?Rx / DC Orders ?ED Discharge Orders   ? ? None  ? ?  ? ? ?  ?Shon Baton, MD ?12/17/21 0206 ? ?

## 2022-09-08 IMAGING — CT CT KNEE*R* W/O CM
3 series · 11 of 33 positions shown, 13 images · non-contrast
Comparison: 12/16/2021.

CLINICAL DATA: Knee trauma, occult fracture suspected.

EXAM:
CT OF THE RIGHT KNEE WITHOUT CONTRAST
TECHNIQUE: Multidetector CT imaging of the right knee was performed according
to the standard protocol. Multiplanar CT image reconstructions were
also generated.
RADIATION DOSE REDUCTION: This exam was performed according to the
departmental dose-optimization program which includes automated
exposure control, adjustment of the mA and/or kV according to
patient size and/or use of iterative reconstruction technique.

[Series 4: lfov ext 3.0 b40s · axial · 0.36mm/px · z∈[+386,+554]mm · 3 of 93 slices shown, 4 images]
[im 22/93  soft-tissue]
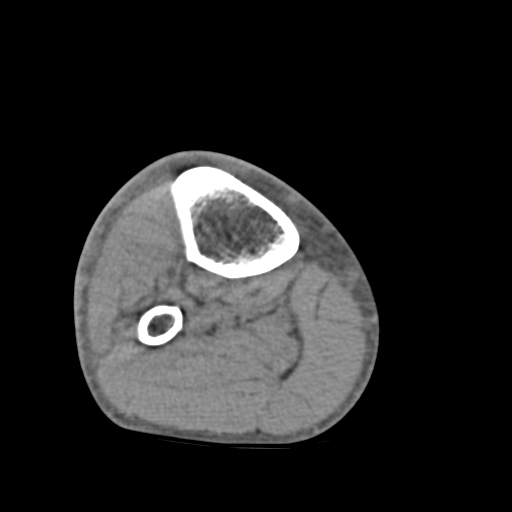
[im 22/93  bone]
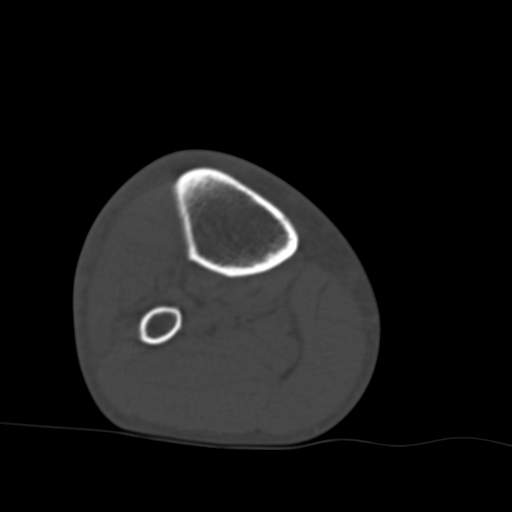
[im 50/93  bone]
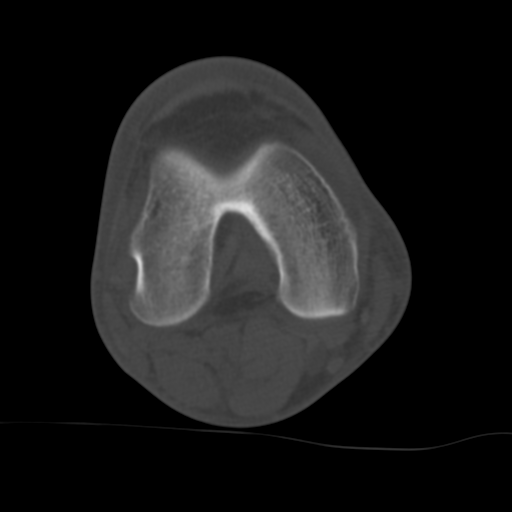
[im 78/93  bone]
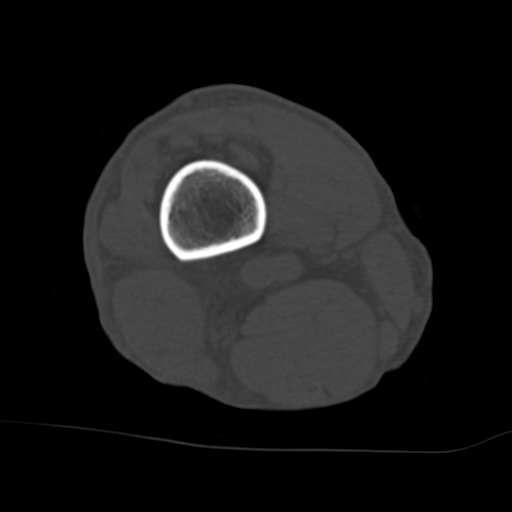

[Series 7: coronalsoft tissue · coronal · 0.27mm/px · 3 of 83 slices shown]
[im 17/83  bone]
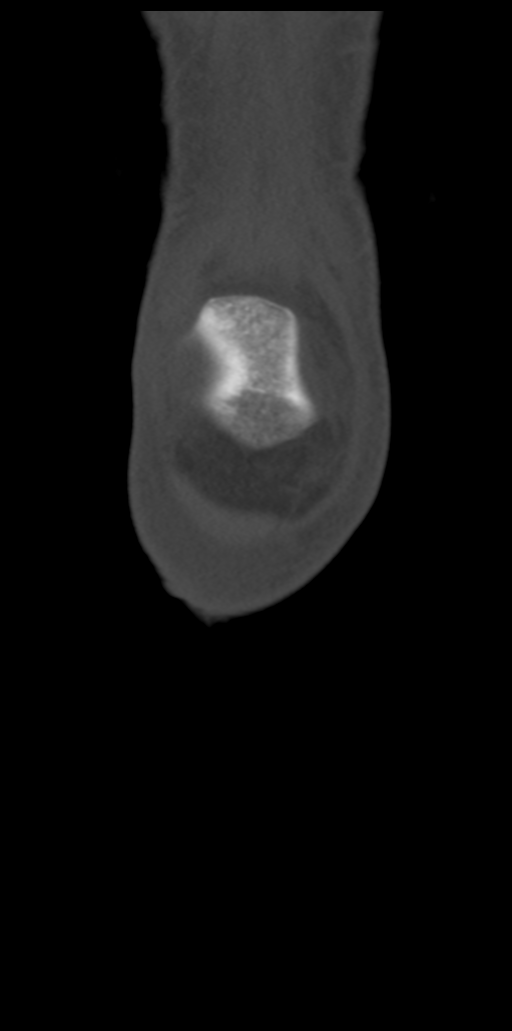
[im 33/83  bone]
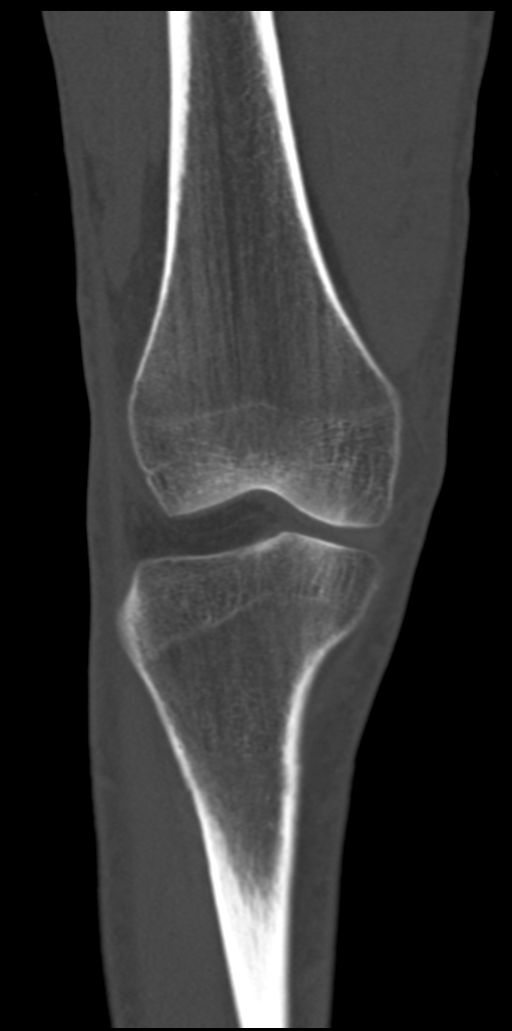
[im 50/83  bone]
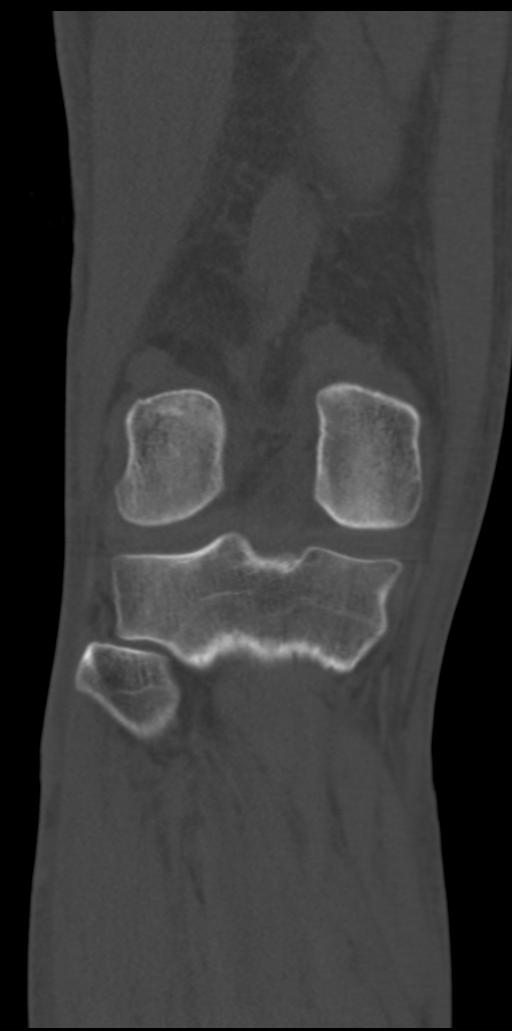

[Series 8: sagittalsoft tissue · sagittal · 0.33mm/px · 5 of 68 slices shown, 6 images]
[im 23/68  bone]
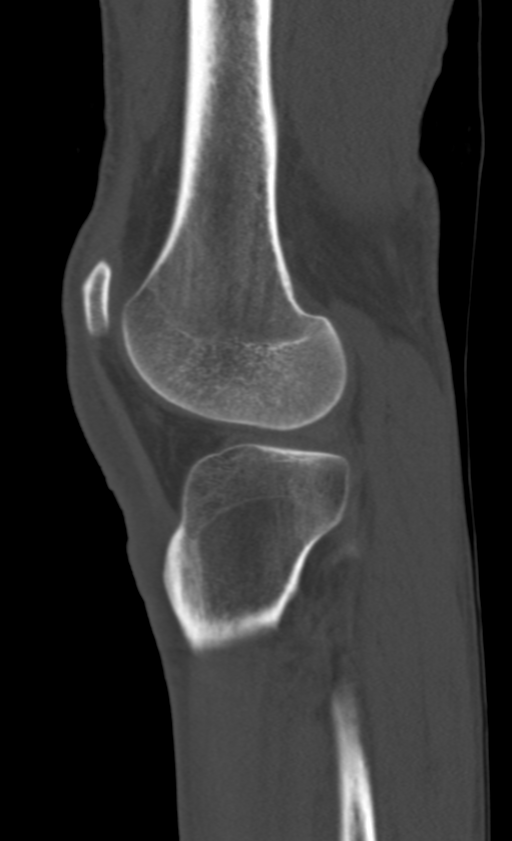
[im 28/68  bone]
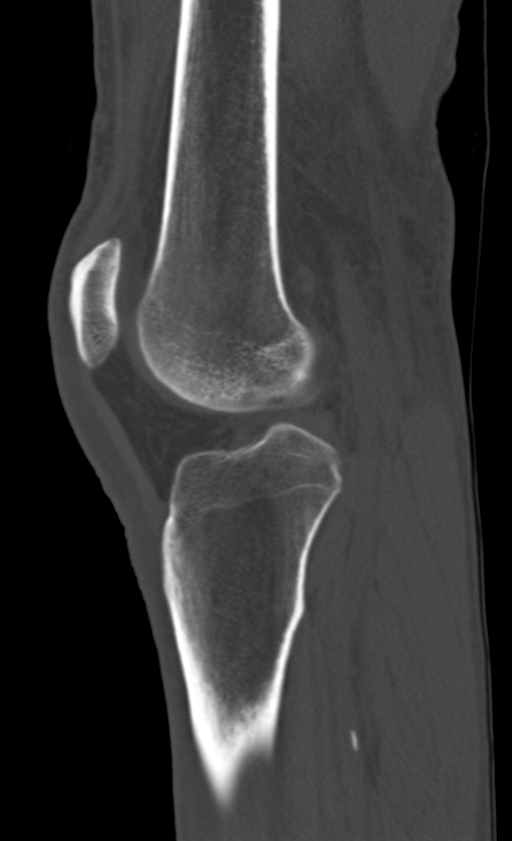
[im 34/68  soft-tissue]
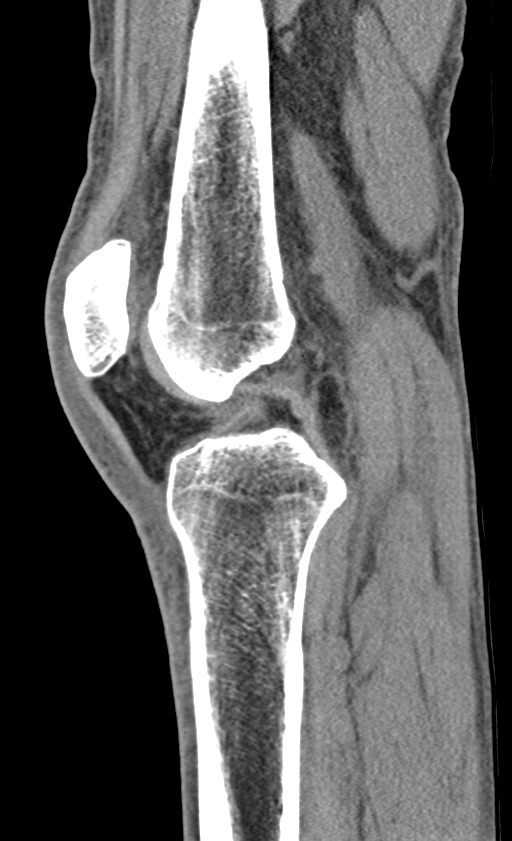
[im 34/68  bone]
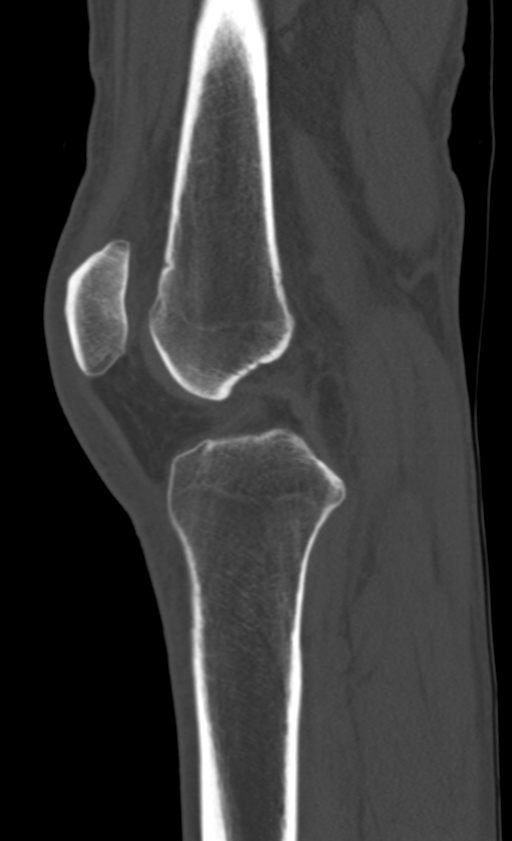
[im 40/68  bone]
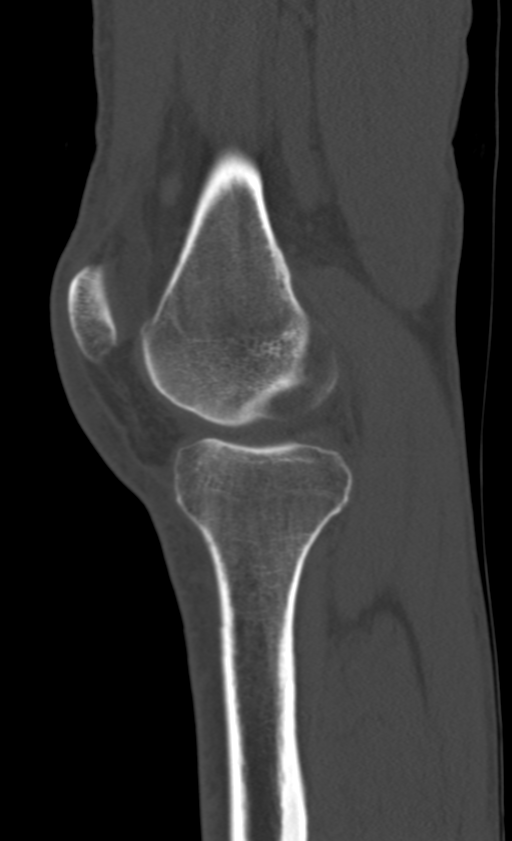
[im 45/68  bone]
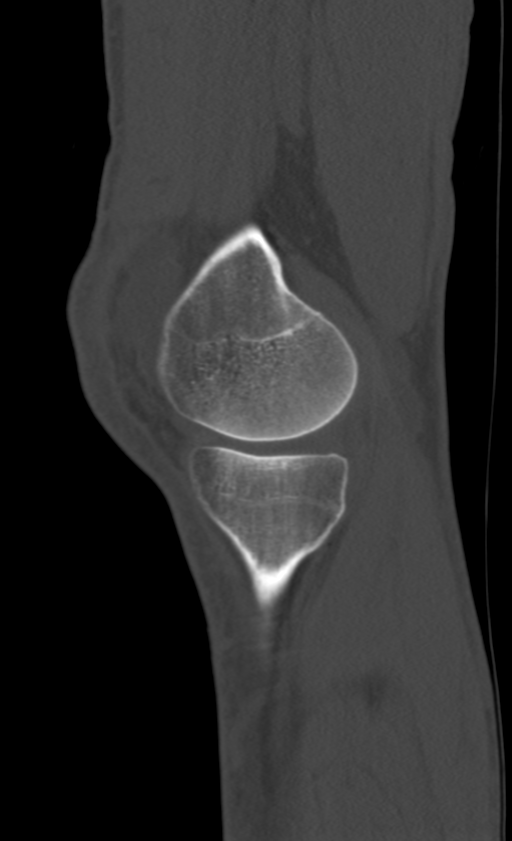

[11 of 33 positions shown; findings below may reference images not displayed]

FINDINGS: Bones/Joint/Cartilage

No acute fracture or dislocation. There is a corticated bony density
adjacent to the fibular head which is likely chronic. A small to
moderate joint effusion is noted. There is tricompartmental mild
joint space narrowing and subchondral sclerosis.

Ligaments

Suboptimally assessed by CT.

Muscles and Tendons

No significant abnormality. The patellar tendon and quadriceps
tendon are within normal limits.

Soft tissues

Subcutaneous fat stranding is present anterior to the patella. Mild
atherosclerotic calcification of the distal popliteal artery.
IMPRESSION: 1. No acute fracture or dislocation. Corticated bony density
adjacent to the fibular head is likely chronic.
2. Small to moderate joint effusion.
3. Mild tricompartmental degenerative changes.

## 2022-12-07 ENCOUNTER — Emergency Department (HOSPITAL_COMMUNITY): Payer: Medicaid Other

## 2022-12-07 ENCOUNTER — Emergency Department (HOSPITAL_COMMUNITY)
Admission: EM | Admit: 2022-12-07 | Discharge: 2022-12-07 | Disposition: A | Discharge status: Home / Self Care | Payer: Medicaid Other | Attending: Emergency Medicine | Admitting: Emergency Medicine

## 2022-12-07 ENCOUNTER — Other Ambulatory Visit: Payer: Self-pay

## 2022-12-07 ENCOUNTER — Encounter (HOSPITAL_COMMUNITY): Payer: Self-pay | Admitting: *Deleted

## 2022-12-07 DIAGNOSIS — R6883 Chills (without fever): Secondary | ICD-10-CM | POA: Insufficient documentation

## 2022-12-07 DIAGNOSIS — R062 Wheezing: Secondary | ICD-10-CM | POA: Insufficient documentation

## 2022-12-07 DIAGNOSIS — R051 Acute cough: Secondary | ICD-10-CM | POA: Diagnosis not present

## 2022-12-07 DIAGNOSIS — R059 Cough, unspecified: Secondary | ICD-10-CM | POA: Diagnosis present

## 2022-12-07 DIAGNOSIS — R0981 Nasal congestion: Secondary | ICD-10-CM | POA: Insufficient documentation

## 2022-12-07 DIAGNOSIS — J209 Acute bronchitis, unspecified: Secondary | ICD-10-CM | POA: Diagnosis not present

## 2022-12-07 HISTORY — DX: Bronchitis, not specified as acute or chronic: J40

## 2022-12-07 MED ORDER — IPRATROPIUM-ALBUTEROL 0.5-2.5 (3) MG/3ML IN SOLN
3.0000 mL | Freq: Once | RESPIRATORY_TRACT | Status: AC
  Administered 2022-12-07: 3 mL via RESPIRATORY_TRACT
  Filled 2022-12-07: qty 3

## 2022-12-07 MED ORDER — ALBUTEROL SULFATE HFA 108 (90 BASE) MCG/ACT IN AERS: 1.0000 | INHALATION_SPRAY | Freq: Four times a day (QID) | RESPIRATORY_TRACT | 0 refills | Status: AC | PRN

## 2022-12-07 MED ORDER — PREDNISONE 20 MG PO TABS
60.0000 mg | ORAL_TABLET | Freq: Once | ORAL | Status: AC
  Administered 2022-12-07: 60 mg via ORAL
  Filled 2022-12-07: qty 3

## 2022-12-07 MED ORDER — PREDNISONE 50 MG PO TABS: 50.0000 mg | ORAL_TABLET | Freq: Every day | ORAL | 0 refills | Status: AC

## 2022-12-07 NOTE — ED Provider Notes (Signed)
[place] EMERGENCY DEPARTMENT AT [facility] Provider Note   CSN: [id number] Arrival date & time: 12/07/22  1403    History  Chief Complaint  Patient presents with   Cough    Dwayne Craig is a 62 y.o. male history of recurrent bronchitis here for evaluation of cough and congestion x 2 days.  Productive of green sputum.  Feels wheezy.  No chest pain, shortness of breath, fever.  No sore throat, difficulty tolerating p.o. intake, abdominal pain, pain or swelling to lower extremities.  No history of PE or DVT.  Some chills, no fever.  Feels like his prior episodes of bronchitis.  States he typically needs a "breathing treatment and steroids." No prior hospitalizations for similar.  HPI     Home Medications Prior to Admission medications   Medication Sig Start Date End Date Taking? Authorizing Provider  albuterol (VENTOLIN HFA) 108 (90 Base) MCG/ACT inhaler Inhale 1-2 puffs into the lungs every 6 (six) hours as needed for wheezing or shortness of breath. 12/07/22  Yes Orlandria Kissner A, PA-C  predniSONE (DELTASONE) 50 MG tablet Take 1 tablet (50 mg total) by mouth daily for 4 days. 12/07/22 12/11/22 Yes Bessie Livingood A, PA-C  ibuprofen (ADVIL) 600 MG tablet Take 1 tablet (600 mg total) by mouth every 6 (six) hours as needed. Patient not taking: Reported on 12/04/2021 04/30/20   Merrilee Jansky, MD  Magnesium Sulfate, Laxative, (EPSOM SALT) GRAN Mix with warm water, soak hand 3 times daily for 20 minutes Patient not taking: Reported on 12/04/2021 10/21/19   Jeannie Fend, PA-C      Allergies    Patient has no known allergies.    Review of Systems   Review of Systems  Constitutional:  Positive for chills.  HENT:  Positive for congestion, postnasal drip and rhinorrhea. Negative for sinus pressure, sinus pain, sneezing, sore throat, trouble swallowing and voice change.   Respiratory:  Positive for cough and wheezing. Negative for apnea, choking, chest tightness, shortness  of breath and stridor.   Cardiovascular: Negative.   Genitourinary: Negative.   Musculoskeletal: Negative.   Skin: Negative.   Neurological: Negative.   All other systems reviewed and are negative.   Physical Exam Updated Vital Signs BP 128/78 (BP Location: Right Arm)   Pulse 60   Temp 98.3 F (36.8 C) (Oral)   Resp 17   Ht 5\' 11"  (1.803 m)   Wt 74.8 kg   SpO2 94%   BMI 23.00 kg/m  Physical Exam Vitals and nursing note reviewed.  Constitutional:      General: He is not in acute distress.    Appearance: He is well-developed. He is not ill-appearing, toxic-appearing or diaphoretic.  HENT:     Head: Normocephalic and atraumatic.     Nose: Nose normal.  Eyes:     Pupils: Pupils are equal, round, and reactive to light.  Cardiovascular:     Rate and Rhythm: Normal rate and regular rhythm.     Pulses: Normal pulses.     Heart sounds: Normal heart sounds.  Pulmonary:     Effort: Pulmonary effort is normal. No respiratory distress.     Breath sounds: Normal breath sounds.     Comments: Expiratory wheeze. No rhonchi or rales Abdominal:     General: Bowel sounds are normal. There is no distension.     Palpations: Abdomen is soft.     Tenderness: There is no abdominal tenderness. There is no guarding or rebound.  Musculoskeletal:  General: Normal range of motion.     Cervical back: Normal range of motion and neck supple.     Comments: Non tender, compartments soft. Homans neg  Skin:    General: Skin is warm and dry.     Capillary Refill: Capillary refill takes less than 2 seconds.     Comments: No rashes, lesions, LE swelling.  Neurological:     General: No focal deficit present.     Mental Status: He is alert and oriented to person, place, and time.     ED Results / Procedures / Treatments   Labs (all labs ordered are listed, but only abnormal results are displayed) Labs Reviewed - No data to display  EKG EKG Interpretation  Date/Time:  Sunday December 07 2022  15:34:48 EDT Ventricular Rate:  58 PR Interval:  118 QRS Duration: 112 QT Interval:  440 QTC Calculation: 431 R Axis:   62 Text Interpretation: Sinus bradycardia Cannot rule out Inferior infarct , age undetermined No significant change since last tracing When compared with ECG of 04-Dec-2021 06:33, PREVIOUS ECG IS PRESENT Confirmed by Gwyneth Sprout ([id number]) on 12/07/2022 4:06:18 PM  Radiology DG Chest 2 View  Result Date: 12/07/2022 CLINICAL DATA:  Cough and congestion for 2 days EXAM: CHEST - 2 VIEW COMPARISON:  02/27/2018 FINDINGS: Frontal and lateral views of the chest demonstrate a stable cardiac silhouette. No airspace disease, effusion, or pneumothorax. No acute bony abnormalities. IMPRESSION: 1. No acute intrathoracic process. Electronically Signed   By: Sharlet Salina M.D.   On: 12/07/2022 15:04    Procedures Procedures    Medications Ordered in ED Medications  predniSONE (DELTASONE) tablet 60 mg (60 mg Oral Given 12/07/22 1700)  ipratropium-albuterol (DUONEB) 0.5-2.5 (3) MG/3ML nebulizer solution 3 mL (3 mLs Nebulization Given 12/07/22 1700)   ED Course/ Medical Decision Making/ A&P    62 year old hx of recurrent bronchitis here for evaluation of cough and congestion.  He is afebrile, nonseptic, not ill-appearing.  Does not clinically fluid overloaded, no PND, orthopnea.  No history of CHF.  No chest pain, shortness of breath, no tachycardia, clinical evidence of DVT on exam, Wells criteria low risk.  P.o. intake, appears clinically well-hydrated.  Does have expiratory wheeze on exam.  Will plan on chest x-ray, EKG, steroids, breathing treatment and reassess  Labs and imaging personally viewed and interpreted:  Chest x-ray without pneumonia, pneumothorax, infiltrates EKG without ischemic changes  Went to reassess patient he states he needs to catch the bus.  He has not his nebulizers or steroids.  I was able to convince him to have his nebulizer here and steroids.  Went to  reassess patient however states he has to leave as the bus is leaving.  He got up from the bed.  He still has wheezing.  I recommended further workup here in the emergency department with additional treatments, possible blood work however he declined.  Will write for breathing treatments at home, steroids.  I encouraged him to follow-up with PCP, return for new or worsening symptoms.   We discussed the nature and purpose, risks and benefits, as well as, the alternatives of treatment. Time was given to allow the opportunity to ask questions and consider their options, and after the discussion, the patient decided to refuse the offerred treatment. The patient was informed that refusal could lead to, but was not limited to, death, permanent disability, or severe pain. If present, I asked the relatives or significant others to dissuade them without success.  Prior to refusing, I determined that the patient had the capacity to make their decision and understood the consequences of that decision. After refusal, I made every reasonable opportunity to treat them to the best of my ability.  The patient was notified that they may return to the emergency department at any time for further treatment.                               Medical Decision Making Amount and/or Complexity of Data Reviewed External Data Reviewed: radiology, ECG and notes. Radiology: ordered and independent interpretation performed. Decision-making details documented in ED Course. ECG/medicine tests: ordered and independent interpretation performed. Decision-making details documented in ED Course.  Risk OTC drugs. Prescription drug management. Decision regarding hospitalization. Diagnosis or treatment significantly limited by social determinants of health.           Final Clinical Impression(s) / ED Diagnoses Final diagnoses:  Acute cough  Wheeze    Rx / DC Orders ED Discharge Orders          Ordered    albuterol  (VENTOLIN HFA) 108 (90 Base) MCG/ACT inhaler  Every 6 hours PRN        12/07/22 1706    predniSONE (DELTASONE) 50 MG tablet  Daily        12/07/22 1706              Ysenia Filice A, PA-C 12/07/22 2348    Gwyneth Sprout, MD 12/09/22 2338

## 2022-12-07 NOTE — ED Triage Notes (Signed)
Pt states cough and sinus congestion x 2 days.  Denies headache, sore throat or fevers.  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

## 2022-12-07 NOTE — Discharge Instructions (Signed)
It was a pleasure taking care of you here in the emergency department today  As discussed you requested to leave prior to completing treatment to catch the bus.  I have written you for some steroids as well as breathing treatments at home.  Please return for new or worsening symptoms

## 2023-04-11 ENCOUNTER — Other Ambulatory Visit: Payer: Self-pay

## 2023-04-11 ENCOUNTER — Encounter (HOSPITAL_COMMUNITY): Payer: Self-pay

## 2023-04-11 ENCOUNTER — Emergency Department (HOSPITAL_COMMUNITY)
Admission: EM | Admit: 2023-04-11 | Discharge: 2023-04-12 | Disposition: A | Discharge status: Home / Self Care | Payer: Medicaid Other | Attending: Emergency Medicine | Admitting: Emergency Medicine

## 2023-04-11 ENCOUNTER — Emergency Department (HOSPITAL_COMMUNITY): Payer: Medicaid Other

## 2023-04-11 DIAGNOSIS — R0781 Pleurodynia: Secondary | ICD-10-CM | POA: Diagnosis present

## 2023-04-11 DIAGNOSIS — Y9355 Activity, bike riding: Secondary | ICD-10-CM | POA: Diagnosis not present

## 2023-04-11 DIAGNOSIS — W19XXXA Unspecified fall, initial encounter: Secondary | ICD-10-CM

## 2023-04-11 DIAGNOSIS — Z043 Encounter for examination and observation following other accident: Secondary | ICD-10-CM | POA: Diagnosis not present
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
## 2023-04-11 NOTE — ED Provider Triage Note (Signed)
Emergency Medicine Provider Triage Evaluation Note  Dwayne Craig , a 62 y.o. male  was evaluated in triage.  Pt complains of left rib pain s/p mechanical fall.  Review of Systems  Positive: N/A Negative: SOB, headache, neck pain, back pain, ext pain  Physical Exam  BP (!) 156/103 (BP Location: Left Arm)   Pulse 93   Temp 98.7 F (37.1 C) (Oral)   Resp 20   Ht 5\' 11"  (1.803 m)   Wt 74.8 kg   SpO2 95%   BMI 23.01 kg/m  Gen:   Awake, no distress   Resp:  Normal effort  MSK:   Moves extremities without difficulty. TTP to left chest wall Other:    Medical Decision Making  Medically screening exam initiated at 11:49 PM.  Appropriate orders placed.  Dwayne Craig was informed that the remainder of the evaluation will be completed by another provider, this initial triage assessment does not replace that evaluation, and the importance of remaining in the ED until their evaluation is complete.  CXR ordered   Nira Conn, MD 04/11/23 2350

## 2023-04-12 MED ORDER — METHOCARBAMOL 500 MG PO TABS
1000.0000 mg | ORAL_TABLET | Freq: Once | ORAL | Status: AC
  Administered 2023-04-12: 1000 mg via ORAL
  Filled 2023-04-12: qty 2

## 2023-04-12 MED ORDER — METHOCARBAMOL 500 MG PO TABS: 500.0000 mg | ORAL_TABLET | Freq: Two times a day (BID) | ORAL | 0 refills | Status: AC

## 2023-04-12 NOTE — Discharge Instructions (Signed)
Likely you have a bruised rib, given you a muscle relaxer take as prescribed, may use over-the-counter pain medication as needed.  I have given you a incentive spirometer please use 3 times daily for the next 3 weeks as well prevent pneumonia.  May follow-up with community health and wellness for further evaluation.  Come back to the emergency department if you develop chest pain, shortness of breath, severe abdominal pain, uncontrolled nausea, vomiting, diarrhea.
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
